# Patient Record
Sex: Male | Born: 1975
Health system: Southern US, Community
[De-identification: ages and names within clinical notes are randomized; demographics above are authoritative.]

## PROBLEM LIST (undated history)

## (undated) DIAGNOSIS — E785 Hyperlipidemia, unspecified: Secondary | ICD-10-CM

## (undated) HISTORY — PX: VASECTOMY: SHX75

## (undated) HISTORY — DX: Hyperlipidemia, unspecified: E78.5

## (undated) HISTORY — PX: BREAST BIOPSY: SHX20

---

## 2005-09-17 HISTORY — PX: COLONOSCOPY: SHX174

## 2018-10-27 ENCOUNTER — Encounter: Payer: Self-pay | Admitting: Family Medicine

## 2018-10-27 ENCOUNTER — Ambulatory Visit: Payer: BLUE CROSS/BLUE SHIELD | Admitting: Family Medicine

## 2018-10-27 VITALS — BP 116/64 | HR 75 | Temp 98.2°F | Ht 72.0 in | Wt 184.4 lb

## 2018-10-27 DIAGNOSIS — Z131 Encounter for screening for diabetes mellitus: Secondary | ICD-10-CM

## 2018-10-27 DIAGNOSIS — Z113 Encounter for screening for infections with a predominantly sexual mode of transmission: Secondary | ICD-10-CM | POA: Diagnosis not present

## 2018-10-27 DIAGNOSIS — Z13220 Encounter for screening for lipoid disorders: Secondary | ICD-10-CM | POA: Diagnosis not present

## 2018-10-27 DIAGNOSIS — Z0000 Encounter for general adult medical examination without abnormal findings: Secondary | ICD-10-CM | POA: Diagnosis not present

## 2018-10-27 NOTE — Patient Instructions (Addendum)
The labs is open each week day at 7:45 AM.  The lab closes at 5 pm on Monday, Thursday, and Friday.  On Tuesday and Wednesday the lab closes at 5:30pm.  Preventive Care 40-64 Years, Male Preventive care refers to lifestyle choices and visits with your health care provider that can promote health and wellness. What does preventive care include?   A yearly physical exam. This is also called an annual well check.  Dental exams once or twice a year.  Routine eye exams. Ask your health care provider how often you should have your eyes checked.  Personal lifestyle choices, including: ? Daily care of your teeth and gums. ? Regular physical activity. ? Eating a healthy diet. ? Avoiding tobacco and drug use. ? Limiting alcohol use. ? Practicing safe sex. ? Taking low-dose aspirin every day starting at age 16. What happens during an annual well check? The services and screenings done by your health care provider during your annual well check will depend on your age, overall health, lifestyle risk factors, and family history of disease. Counseling Your health care provider may ask you questions about your:  Alcohol use.  Tobacco use.  Drug use.  Emotional well-being.  Home and relationship well-being.  Sexual activity.  Eating habits.  Work and work Statistician. Screening You may have the following tests or measurements:  Height, weight, and BMI.  Blood pressure.  Lipid and cholesterol levels. These may be checked every 5 years, or more frequently if you are over 24 years old.  Skin check.  Lung cancer screening. You may have this screening every year starting at age 70 if you have a 30-pack-year history of smoking and currently smoke or have quit within the past 15 years.  Colorectal cancer screening. All adults should have this screening starting at age 38 and continuing until age 58. Your health care provider may recommend screening at age 93. You will have tests every  1-10 years, depending on your results and the type of screening test. People at increased risk should start screening at an earlier age. Screening tests may include: ? Guaiac-based fecal occult blood testing. ? Fecal immunochemical test (FIT). ? Stool DNA test. ? Virtual colonoscopy. ? Sigmoidoscopy. During this test, a flexible tube with a tiny camera (sigmoidoscope) is used to examine your rectum and lower colon. The sigmoidoscope is inserted through your anus into your rectum and lower colon. ? Colonoscopy. During this test, a long, thin, flexible tube with a tiny camera (colonoscope) is used to examine your entire colon and rectum.  Prostate cancer screening. Recommendations will vary depending on your family history and other risks.  Hepatitis C blood test.  Hepatitis B blood test.  Sexually transmitted disease (STD) testing.  Diabetes screening. This is done by checking your blood sugar (glucose) after you have not eaten for a while (fasting). You may have this done every 1-3 years. Discuss your test results, treatment options, and if necessary, the need for more tests with your health care provider. Vaccines Your health care provider may recommend certain vaccines, such as:  Influenza vaccine. This is recommended every year.  Tetanus, diphtheria, and acellular pertussis (Tdap, Td) vaccine. You may need a Td booster every 10 years.  Varicella vaccine. You may need this if you have not been vaccinated.  Zoster vaccine. You may need this after age 32.  Measles, mumps, and rubella (MMR) vaccine. You may need at least one dose of MMR if you were born in 1957 or  later. You may also need a second dose.  Pneumococcal 13-valent conjugate (PCV13) vaccine. You may need this if you have certain conditions and have not been vaccinated.  Pneumococcal polysaccharide (PPSV23) vaccine. You may need one or two doses if you smoke cigarettes or if you have certain conditions.  Meningococcal  vaccine. You may need this if you have certain conditions.  Hepatitis A vaccine. You may need this if you have certain conditions or if you travel or work in places where you may be exposed to hepatitis A.  Hepatitis B vaccine. You may need this if you have certain conditions or if you travel or work in places where you may be exposed to hepatitis B.  Haemophilus influenzae type b (Hib) vaccine. You may need this if you have certain risk factors. Talk to your health care provider about which screenings and vaccines you need and how often you need them. This information is not intended to replace advice given to you by your health care provider. Make sure you discuss any questions you have with your health care provider. Document Released: 09/30/2015 Document Revised: 10/24/2017 Document Reviewed: 07/05/2015 Elsevier Interactive Patient Education  2019 [employer].

## 2018-10-27 NOTE — Progress Notes (Signed)
Subjective:     Thomas Mahoney is a 43 y.o. male and is here for a comprehensive physical exam. The patient reports no problems.  Pt tries to eat healthy and exercise at least 3 times per week.  Pt drinking mostly water throughout the day.  Takes a MVI daily.  Allergies: Codeine-hives  Past surgical history: Breast biopsy-2004  Social history: Patient is single.  Patient completed some college.  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  Patient recently moved to the area from [place].  Patient denies alcohol, tobacco, drug use.  Healthcare maintenance: Colonoscopy--2007 Immunizations--TB test 2019.  Patient does not get influenza vaccines. Dentist--Dr. Enzo Bi DeShield-Mayes  Family medical history: Mom-alive Sister-Ruisha, alive Daughters (2), alive Son-alive MGM-breast cancer with mets to lungs MGF-colon cancer Social History   Socioeconomic History  . Marital status: Unknown    Spouse name: Not on file  . Number of children: Not on file  . Years of education: Not on file  . Highest education level: Not on file  Occupational History  . Not on file  Social Needs  . Financial resource strain: Not on file  . Food insecurity:    Worry: Not on file    Inability: Not on file  . Transportation needs:    Medical: Not on file    Non-medical: Not on file  Tobacco Use  . Smoking status: Never Smoker  . Smokeless tobacco: Never Used  Substance and Sexual Activity  . Alcohol use: Never    Frequency: Never  . Drug use: Not on file  . Sexual activity: Not on file  Lifestyle  . Physical activity:    Days per week: Not on file    Minutes per session: Not on file  . Stress: Not on file  Relationships  . Social connections:    Talks on phone: Not on file    Gets together: Not on file    Attends religious service: Not on file    Active member of club or organization: Not on file    Attends meetings of clubs or organizations: Not on file     Relationship status: Not on file  . Intimate partner violence:    Fear of current or ex partner: Not on file    Emotionally abused: Not on file    Physically abused: Not on file    Forced sexual activity: Not on file  Other Topics Concern  . Not on file  Social History Narrative  . Not on file   Health Maintenance  Topic Date Due  . HIV Screening  10/19/1989  . TETANUS/TDAP  10/19/1993  . INFLUENZA VACCINE  10/28/2019 (Originally 04/17/2018)    The following portions of the patient's history were reviewed and updated as appropriate: allergies, current medications, past family history, past medical history, past social history, past surgical history and problem list.  Review of Systems A comprehensive review of systems was negative.   Objective:    BP 116/64 (BP Location: Left Arm, Patient Position: Sitting, Cuff Size: Normal)   Pulse 75   Temp 98.2 F (36.8 C) (Oral)   Ht 6' (1.829 m)   Wt 184 lb 6.4 oz (83.6 kg)   BMI 25.01 kg/m  General appearance: alert, cooperative and no distress Head: Normocephalic, without obvious abnormality, atraumatic Eyes: conjunctivae/corneas clear. PERRL, EOM's intact. Fundi benign. Ears: normal TM's and external ear canals both ears Nose: Nares normal. Septum midline. Mucosa normal. No drainage or sinus tenderness. Throat:  lips, mucosa, and tongue normal; teeth and gums normal Neck: no adenopathy, no carotid bruit, no JVD, supple, symmetrical, trachea midline and thyroid not enlarged, symmetric, no tenderness/mass/nodules Lungs: clear to auscultation bilaterally Heart: regular rate and rhythm, S1, S2 normal, no murmur, click, rub or gallop Abdomen: soft, non-tender; bowel sounds normal; no masses,  no organomegaly Extremities: extremities normal, atraumatic, no cyanosis or edema Pulses: 2+ and symmetric Skin: Skin color, texture, turgor normal. No rashes or lesions Lymph nodes: Cervical, supraclavicular, and axillary nodes  normal. Neurologic: Alert and oriented X 3, normal strength and tone. Normal symmetric reflexes. Normal coordination and gait    Assessment:    Healthy male exam.      Plan:   Anticipatory guidance given including wearing seatbelts, smoke detectors in the home, increasing physical activity, increasing p.o. intake of water and vegetables. -Patient to return tomorrow for labs when he is fasting. -We will also obtain STI testing -Offered influenza vaccine, however patient declines -Given handout -Next CPE in 1 year See After Visit Summary for Counseling Recommendations   Follow-up PRN  Grier Mitts, MD

## 2018-10-28 DIAGNOSIS — Z113 Encounter for screening for infections with a predominantly sexual mode of transmission: Secondary | ICD-10-CM | POA: Diagnosis not present

## 2018-10-28 LAB — BASIC METABOLIC PANEL
BUN: 12 mg/dL (ref 6–23)
CO2: 31 mEq/L (ref 19–32)
CREATININE: 1.21 mg/dL (ref 0.40–1.50)
Calcium: 9.9 mg/dL (ref 8.4–10.5)
Chloride: 101 mEq/L (ref 96–112)
GFR: 78.82 mL/min (ref >60.00)
GLUCOSE: 91 mg/dL (ref 70–99)
Potassium: 4.5 mEq/L (ref 3.5–5.1)
Sodium: 139 mEq/L (ref 135–145)

## 2018-10-28 LAB — LIPID PANEL
CHOLESTEROL: 274 mg/dL — AB (ref 0–200)
HDL: 50 mg/dL (ref >39.00)
LDL Cholesterol: 198 mg/dL — ABNORMAL HIGH (ref 0–99)
NonHDL: 224.13
Total CHOL/HDL Ratio: 5
Triglycerides: 130 mg/dL (ref 0.0–149.0)
VLDL: 26 mg/dL (ref 0.0–40.0)

## 2018-10-28 LAB — CBC
HCT: 46.4 % (ref 39.0–52.0)
Hemoglobin: 15.8 g/dL (ref 13.0–17.0)
MCHC: 34 g/dL (ref 30.0–36.0)
MCV: 94.1 fl (ref 78.0–100.0)
Platelets: 217 10*3/uL (ref 150.0–400.0)
RBC: 4.93 Mil/uL (ref 4.22–5.81)
RDW: 12.3 % (ref 11.5–15.5)
WBC: 6.6 10*3/uL (ref 4.0–10.5)

## 2018-10-28 LAB — HEMOGLOBIN A1C: Hgb A1c MFr Bld: 5.8 % (ref 4.6–6.5)

## 2018-10-28 NOTE — Addendum Note (Signed)
Addended by: Elmer Picker on: 10/28/2018 09:07 AM   Modules accepted: Orders

## 2018-10-29 LAB — HIV ANTIBODY (ROUTINE TESTING W REFLEX): HIV 1&2 Ab, 4th Generation: NONREACTIVE

## 2018-10-29 LAB — C. TRACHOMATIS/N. GONORRHOEAE RNA
C. TRACHOMATIS RNA, TMA: NOT DETECTED
N. gonorrhoeae RNA, TMA: NOT DETECTED

## 2018-10-29 LAB — RPR: RPR: NONREACTIVE

## 2018-11-21 ENCOUNTER — Telehealth: Payer: Self-pay | Admitting: *Deleted

## 2018-11-21 DIAGNOSIS — Z3009 Encounter for other general counseling and advice on contraception: Secondary | ICD-10-CM

## 2018-11-21 NOTE — Telephone Encounter (Signed)
Copied from [place] [id number]. Topic: Referral - Request for Referral >> Nov 21, 2018  2:08 PM Vernona Rieger wrote: Has patient seen PCP for this complaint? No *If NO, is insurance requiring patient see PCP for this issue before PCP can refer them? Referral for which specialty: Urology  Preferred provider/office: New to area so he is unsure Reason for referral: Vasectomy

## 2018-11-21 NOTE — Telephone Encounter (Signed)
Ok to place referral.

## 2018-11-21 NOTE — Telephone Encounter (Signed)
Okay to place referral to urology for vasectomy

## 2018-11-24 NOTE — Telephone Encounter (Signed)
Referral to Urology has been placed.

## 2018-12-16 DIAGNOSIS — Z3009 Encounter for other general counseling and advice on contraception: Secondary | ICD-10-CM | POA: Diagnosis not present

## 2019-01-27 DIAGNOSIS — Z302 Encounter for sterilization: Secondary | ICD-10-CM | POA: Diagnosis not present

## 2019-02-04 ENCOUNTER — Other Ambulatory Visit: Payer: Self-pay | Admitting: Family Medicine

## 2019-02-04 DIAGNOSIS — E785 Hyperlipidemia, unspecified: Secondary | ICD-10-CM

## 2019-02-05 ENCOUNTER — Other Ambulatory Visit (INDEPENDENT_AMBULATORY_CARE_PROVIDER_SITE_OTHER): Payer: BLUE CROSS/BLUE SHIELD

## 2019-02-05 ENCOUNTER — Other Ambulatory Visit: Payer: Self-pay

## 2019-02-05 DIAGNOSIS — E785 Hyperlipidemia, unspecified: Secondary | ICD-10-CM

## 2019-02-05 LAB — LIPID PANEL
Cholesterol: 239 mg/dL — ABNORMAL HIGH (ref 0–200)
HDL: 48.9 mg/dL (ref >39.00)
LDL Cholesterol: 170 mg/dL — ABNORMAL HIGH (ref 0–99)
NonHDL: 189.84
Total CHOL/HDL Ratio: 5
Triglycerides: 100 mg/dL (ref 0.0–149.0)
VLDL: 20 mg/dL (ref 0.0–40.0)

## 2019-06-19 DIAGNOSIS — Z20828 Contact with and (suspected) exposure to other viral communicable diseases: Secondary | ICD-10-CM | POA: Diagnosis not present

## 2019-12-02 ENCOUNTER — Encounter: Payer: BLUE CROSS/BLUE SHIELD | Admitting: Family Medicine

## 2019-12-30 ENCOUNTER — Other Ambulatory Visit: Payer: Self-pay

## 2019-12-31 ENCOUNTER — Encounter: Payer: Self-pay | Admitting: Family Medicine

## 2019-12-31 ENCOUNTER — Ambulatory Visit (INDEPENDENT_AMBULATORY_CARE_PROVIDER_SITE_OTHER): Payer: BC Managed Care – PPO | Admitting: Family Medicine

## 2019-12-31 ENCOUNTER — Other Ambulatory Visit (HOSPITAL_COMMUNITY)
Admission: RE | Admit: 2019-12-31 | Discharge: 2019-12-31 | Disposition: A | Discharge status: Home / Self Care | Payer: BC Managed Care – PPO | Source: Ambulatory Visit | Attending: Family Medicine | Admitting: Family Medicine

## 2019-12-31 VITALS — BP 98/68 | HR 66 | Temp 97.8°F | Wt 181.0 lb

## 2019-12-31 DIAGNOSIS — E782 Mixed hyperlipidemia: Secondary | ICD-10-CM

## 2019-12-31 DIAGNOSIS — Z125 Encounter for screening for malignant neoplasm of prostate: Secondary | ICD-10-CM

## 2019-12-31 DIAGNOSIS — T23122A Burn of first degree of single left finger (nail) except thumb, initial encounter: Secondary | ICD-10-CM

## 2019-12-31 DIAGNOSIS — Z113 Encounter for screening for infections with a predominantly sexual mode of transmission: Secondary | ICD-10-CM | POA: Diagnosis not present

## 2019-12-31 DIAGNOSIS — R200 Anesthesia of skin: Secondary | ICD-10-CM | POA: Diagnosis not present

## 2019-12-31 DIAGNOSIS — Z0000 Encounter for general adult medical examination without abnormal findings: Secondary | ICD-10-CM | POA: Diagnosis not present

## 2019-12-31 DIAGNOSIS — R7303 Prediabetes: Secondary | ICD-10-CM | POA: Diagnosis not present

## 2019-12-31 LAB — LIPID PANEL
Cholesterol: 257 mg/dL — ABNORMAL HIGH (ref 0–200)
HDL: 46.6 mg/dL (ref >39.00)
LDL Cholesterol: 190 mg/dL — ABNORMAL HIGH (ref 0–99)
NonHDL: 210.75
Total CHOL/HDL Ratio: 6
Triglycerides: 106 mg/dL (ref 0.0–149.0)
VLDL: 21.2 mg/dL (ref 0.0–40.0)

## 2019-12-31 LAB — COMPREHENSIVE METABOLIC PANEL
ALT: 25 U/L (ref 0–53)
AST: 22 U/L (ref 0–37)
Albumin: 4.6 g/dL (ref 3.5–5.2)
Alkaline Phosphatase: 74 U/L (ref 39–117)
BUN: 11 mg/dL (ref 6–23)
CO2: 31 mEq/L (ref 19–32)
Calcium: 10 mg/dL (ref 8.4–10.5)
Chloride: 101 mEq/L (ref 96–112)
Creatinine, Ser: 1.19 mg/dL (ref 0.40–1.50)
GFR: 79.92 mL/min (ref >60.00)
Glucose, Bld: 91 mg/dL (ref 70–99)
Potassium: 4.6 mEq/L (ref 3.5–5.1)
Sodium: 137 mEq/L (ref 135–145)
Total Bilirubin: 0.4 mg/dL (ref 0.2–1.2)
Total Protein: 8 g/dL (ref 6.0–8.3)

## 2019-12-31 LAB — CBC
HCT: 46.6 % (ref 39.0–52.0)
Hemoglobin: 15.6 g/dL (ref 13.0–17.0)
MCHC: 33.4 g/dL (ref 30.0–36.0)
MCV: 94.7 fl (ref 78.0–100.0)
Platelets: 239 10*3/uL (ref 150.0–400.0)
RBC: 4.92 Mil/uL (ref 4.22–5.81)
RDW: 12.6 % (ref 11.5–15.5)
WBC: 7.8 10*3/uL (ref 4.0–10.5)

## 2019-12-31 LAB — HEMOGLOBIN A1C: Hgb A1c MFr Bld: 5.8 % (ref 4.6–6.5)

## 2019-12-31 LAB — VITAMIN B12: Vitamin B-12: 1044 pg/mL — ABNORMAL HIGH (ref 211–911)

## 2019-12-31 LAB — TSH: TSH: 0.71 u[IU]/mL (ref 0.35–4.50)

## 2019-12-31 LAB — PSA: PSA: 1.46 ng/mL (ref 0.10–4.00)

## 2019-12-31 NOTE — Progress Notes (Signed)
Subjective:     Thomas Mahoney is a 44 y.o. male and is here for a comprehensive physical exam. The patient reports problems - numbness in L fingers and burn of finger.  Pt notes numbness in fingers of left hand after receiving doses of Moderna Covid-19 vaccine.  Pt's first dose was on 2/6 and second on 3/6.  Pt noted numbness after first dose.  Happens intermittently, does not last long.  Sensation is sharp, radiating in nature to fingers.  Pt not had to take anything for this sensation.   Pt has never had this happen before.  Typically does not get flu shots.  Denies edema, erythema, weakness, wrist pain, pain that wakes him up at night.  Pt also notes small burn on left finger after touching a hot cast iron hand.    Overall pt states he is doing well.  Since last LMP patient has eliminated processed sugars from his diet, drinking well water, and trying to be healthy overall.  Expressed concern that he may have had Covid in early March 2020 as he became sick.  Symptoms at the time were "worse than the flu".  Pt stayed home from work x1 week.  States his son then got sick.  Pt's 15 yo daughter is doing well.   Pt notes getting a promotion and having to relocate to [place], [place], but drove down for the [facility].  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  Social History   Socioeconomic History  . Marital status: Unknown    Spouse name: Not on file  . Number of children: Not on file  . Years of education: Not on file  . Highest education level: Not on file  Occupational History  . Not on file  Tobacco Use  . Smoking status: Never Smoker  . Smokeless tobacco: Never Used  Substance and Sexual Activity  . Alcohol use: Never  . Drug use: Not on file  . Sexual activity: Not on file  Other Topics Concern  . Not on file  Social History Narrative  . Not on file   Social Determinants of Health   Financial Resource Strain:   . Difficulty of Paying Living Expenses:   Food  Insecurity:   . Worried About Charity fundraiser in the Last Year:   . Arboriculturist in the Last Year:   Transportation Needs:   . Film/video editor (Medical):   Marland Kitchen Lack of Transportation (Non-Medical):   Physical Activity:   . Days of Exercise per Week:   . Minutes of Exercise per Session:   Stress:   . Feeling of Stress :   Social Connections:   . Frequency of Communication with Friends and Family:   . Frequency of Social Gatherings with Friends and Family:   . Attends Religious Services:   . Active Member of Clubs or Organizations:   . Attends Archivist Meetings:   Marland Kitchen Marital Status:   Intimate Partner Violence:   . Fear of Current or Ex-Partner:   . Emotionally Abused:   Marland Kitchen Physically Abused:   . Sexually Abused:    Health Maintenance  Topic Date Due  . TETANUS/TDAP  Never done  . INFLUENZA VACCINE  04/17/2020  . HIV Screening  Completed    The following portions of the patient's history were reviewed and updated as appropriate: allergies, current medications, past family history, past medical history, past social history, past surgical history and problem list.  Review of Systems Pertinent items noted in HPI and remainder of comprehensive ROS otherwise negative.   Objective:    BP 98/68 (BP Location: Left Arm, Patient Position: Sitting, Cuff Size: Normal)   Pulse 66   Temp 97.8 F (36.6 C) (Temporal)   Wt 181 lb (82.1 kg)   SpO2 98%   BMI 24.55 kg/m  General appearance: alert, cooperative and no distress Head: Normocephalic, without obvious abnormality, atraumatic Eyes: conjunctivae/corneas clear. PERRL, EOM's intact. Fundi benign. Ears: normal TM's and external ear canals both ears Nose: Nares normal. Septum midline. Mucosa normal. No drainage or sinus tenderness. Throat: lips, mucosa, and tongue normal; teeth and gums normal Neck: no adenopathy, no carotid bruit, no JVD, supple, symmetrical, trachea midline and thyroid not enlarged,  symmetric, no tenderness/mass/nodules Lungs: clear to auscultation bilaterally Heart: regular rate and rhythm, S1, S2 normal, no murmur, click, rub or gallop Abdomen: soft, non-tender; bowel sounds normal; no masses,  no organomegaly Extremities: extremities normal, atraumatic, no cyanosis or edema no deformities.  No TTP of bilateral arms, forearms, hands. B/l grip strength 5/5. Pulses: 2+ and symmetric Skin: Skin color, texture, turgor normal. No rashes.  Left proximal fourth digit palmar surface with 1.5 cm oval-shaped area, gray in color, slightly raised and fluid-filled, tender to palpation without erythema Lymph nodes: Cervical, supraclavicular, and axillary nodes normal. Neurologic: Alert and oriented X 3, normal strength and tone. Normal symmetric reflexes. Normal coordination and gait Sensation intact in bilateral upper extremities.  Negative Tinel and Phalen's bilaterally.  No numbness produced with tapping on bilateral lateral and medial epicondyles.  Negative axial loading.     Assessment:    Healthy male exam with numbness in fingers of left hand after COVID vaccine.     Plan:     Anticipatory guidance given including wearing seatbelts, smoke detectors in the home, increasing physical activity, increasing p.o. intake of water and vegetables. -will obtian labs -Reviewed preventive care.  Consider starting colon cancer screening at age 32 in [place] males -Given handout -Next CPE in 1 year See After Visit Summary for Counseling Recommendations    Superficial burn of finger of left hand, initial encounter -Silvadene cream applied -Continue Silvadene -Given handout -Given precautions for worsening  Prediabetes  -Hemoglobin A1c 5.8% on 10/28/2018 -Continue lifestyle modifications - Plan: Hemoglobin A1c  Mixed hyperlipidemia -Elevated total and LDL cholesterol in the past -Continue lifestyle modifications -Consider medications for continued elevation - Plan: Lipid  panel  Screening for prostate cancer  - Plan: PSA  Numbness  -Likely neuropathy 2/2 Covid vaccine injections.  Also consider vitamin deficiencies-however less likely as only affecting fingers of left hand, cervical radiculopathy-though exam negative for symptoms with axial loading, other causes of nerve compression such as postural -Discussed supportive care - Plan: Vitamin B12, TSH  Routine screening for STI (sexually transmitted infection)  - Plan: RPR, HIV antibody (with reflex), Urine cytology ancillary only  Follow-up as needed  Grier Mitts, MD

## 2019-12-31 NOTE — Patient Instructions (Signed)
Preventive Care 41-45 Years Old, Male Preventive care refers to lifestyle choices and visits with your health care provider that can promote health and wellness. This includes:  A yearly physical exam. This is also called an annual well check.  Regular dental and eye exams.  Immunizations.  Screening for certain conditions.  Healthy lifestyle choices, such as eating a healthy diet, getting regular exercise, not using drugs or products that contain nicotine and tobacco, and limiting alcohol use. What can I expect for my preventive care visit? Physical exam Your health care provider will check:  Height and weight. These may be used to calculate body mass index (BMI), which is a measurement that tells if you are at a healthy weight.  Heart rate and blood pressure.  Your skin for abnormal spots. Counseling Your health care provider may ask you questions about:  Alcohol, tobacco, and drug use.  Emotional well-being.  Home and relationship well-being.  Sexual activity.  Eating habits.  Work and work Statistician. What immunizations do I need?  Influenza (flu) vaccine  This is recommended every year. Tetanus, diphtheria, and pertussis (Tdap) vaccine  You may need a Td booster every 10 years. Varicella (chickenpox) vaccine  You may need this vaccine if you have not already been vaccinated. Zoster (shingles) vaccine  You may need this after age 64. Measles, mumps, and rubella (MMR) vaccine  You may need at least one dose of MMR if you were born in 1957 or later. You may also need a second dose. Pneumococcal conjugate (PCV13) vaccine  You may need this if you have certain conditions and were not previously vaccinated. Pneumococcal polysaccharide (PPSV23) vaccine  You may need one or two doses if you smoke cigarettes or if you have certain conditions. Meningococcal conjugate (MenACWY) vaccine  You may need this if you have certain conditions. Hepatitis A  vaccine  You may need this if you have certain conditions or if you travel or work in places where you may be exposed to hepatitis A. Hepatitis B vaccine  You may need this if you have certain conditions or if you travel or work in places where you may be exposed to hepatitis B. Haemophilus influenzae type b (Hib) vaccine  You may need this if you have certain risk factors. Human papillomavirus (HPV) vaccine  If recommended by your health care provider, you may need three doses over 6 months. You may receive vaccines as individual doses or as more than one vaccine together in one shot (combination vaccines). Talk with your health care provider about the risks and benefits of combination vaccines. What tests do I need? Blood tests  Lipid and cholesterol levels. These may be checked every 5 years, or more frequently if you are over 60 years old.  Hepatitis C test.  Hepatitis B test. Screening  Lung cancer screening. You may have this screening every year starting at age 43 if you have a 30-pack-year history of smoking and currently smoke or have quit within the past 15 years.  Prostate cancer screening. Recommendations will vary depending on your family history and other risks.  Colorectal cancer screening. All adults should have this screening starting at age 72 and continuing until age 2. Your health care provider may recommend screening at age 14 if you are at increased risk. You will have tests every 1-10 years, depending on your results and the type of screening test.  Diabetes screening. This is done by checking your blood sugar (glucose) after you have not eaten  for a while (fasting). You may have this done every 1-3 years.  Sexually transmitted disease (STD) testing. Follow these instructions at home: Eating and drinking  Eat a diet that includes fresh fruits and vegetables, whole grains, lean protein, and low-fat dairy products.  Take vitamin and mineral supplements as  recommended by your health care provider.  Do not drink alcohol if your health care provider tells you not to drink.  If you drink alcohol: ? Limit how much you have to 0-2 drinks a day. ? Be aware of how much alcohol is in your drink. In the [place] one drink equals one 12 oz bottle of beer (355 mL), one 5 oz glass of wine (148 mL), or one 1 oz glass of hard liquor (44 mL). Lifestyle  Take daily care of your teeth and gums.  Stay active. Exercise for at least 30 minutes on 5 or more days each week.  Do not use any products that contain nicotine or tobacco, such as cigarettes, e-cigarettes, and chewing tobacco. If you need help quitting, ask your health care provider.  If you are sexually active, practice safe sex. Use a condom or other form of protection to prevent STIs (sexually transmitted infections).  Talk with your health care provider about taking a low-dose aspirin every day starting at age 14. What's next?  Go to your health care provider once a year for a well check visit.  Ask your health care provider how often you should have your eyes and teeth checked.  Stay up to date on all vaccines. This information is not intended to replace advice given to you by your health care provider. Make sure you discuss any questions you have with your health care provider. Document Revised: 08/28/2018 Document Reviewed: 08/28/2018 Elsevier Patient Education  [place], Adult A burn is an injury to the skin or the tissues under the skin. There are three types of burns:  First degree. These burns may cause the skin to be red and slightly swollen.  Second degree. These burns are very painful and cause the skin to be very red. The skin may also leak fluid, look shiny, and develop blisters.  Third degree. These burns cause permanent damage. They either turn the skin white or black and make it look charred, dry, and leathery. Taking care of your burn properly can help to  prevent pain and infection. It can also help the burn to heal more quickly. What are the risks? Complications from burns include:  Damage to the skin.  Reduced blood flow near the injury.  Dead tissue.  Scarring.  Problems with movement, if the burn happened near a joint or on the hands or feet. Severe burns can lead to problems that affect the whole body, such as:  Fluid loss.  Less blood circulating in the body.  Inability to maintain a normal core body temperature (thermoregulation).  Infection.  Shock.  Problems breathing. How to care for a first-degree burn Right after a burn:  Rinse or soak the burn under cool water until the pain stops. Do not put ice on your burn. This can cause more damage.  Lightly cover the burn with a sterile cloth (dressing). Burn care  Follow instructions from your health care provider about: ? How to clean and take care of the burn. ? When to change and remove the dressing.  Check your burn every day for signs of infection. Check for: ? More redness, swelling, or pain. ?  Warmth. ? Pus or a bad smell. Medicine  Take over-the-counter and prescription medicines only as told by your health care provider.  If you were prescribed antibiotic medicine, take or apply it as told by your health care provider. Do not stop using the antibiotic even if your condition improves. General instructions  To prevent infection, do not put butter, oil, or other home remedies on your burn.  Do not rub your burn, even when you are cleaning it.  Protect your burn from the sun. How to care for a second-degree burn Right after a burn:  Rinse or soak the burn under cool water. Do this for several minutes. Do not put ice on your burn. This can cause more damage.  Lightly cover the burn with a sterile cloth (dressing). Burn care  Raise (elevate) the injured area above the level of your heart while sitting or lying down.  Follow instructions from your  health care provider about: ? How to clean and take care of the burn. ? When to change and remove the dressing.  Check your burn every day for signs of infection. Check for: ? More redness, swelling, or pain. ? Warmth. ? Pus or a bad smell. Medicine   Take over-the-counter and prescription medicines only as told by your health care provider.  If you were prescribed antibiotic medicine, take or apply it as told by your health care provider. Do not stop using the antibiotic even if your condition improves. General instructions  To prevent infection: ? Do not put butter, oil, or other home remedies on the burn. ? Do not scratch or pick at the burn. ? Do not break any blisters. ? Do not peel skin.  Do not rub your burn, even when you are cleaning it.  Protect your burn from the sun. How to care for a third-degree burn Right after a burn:  Lightly cover the burn with gauze.  Seek immediate medical attention. Burn care  Raise (elevate) the injured area above the level of your heart while sitting or lying down.  Drink enough fluid to keep your urine clear or pale yellow.  Rest as told by your health care provider. Do not participate in sports or other physical activities until your health care provider approves.  Follow instructions from your health care provider about: ? How to clean and take care of the burn. ? When to change and remove the dressing.  Check your burn every day for signs of infection. Check for: ? More redness, swelling, or pain. ? Warmth. ? Pus or a bad smell. Medicine  Take over-the-counter and prescription medicines only as told by your health care provider.  If you were prescribed antibiotic medicine, take or apply it as told by your health care provider. Do not stop using the antibiotic even if your condition improves. General instructions  To prevent infection: ? Do not put butter, oil, or other home remedies on the burn. ? Do not scratch or  pick at the burn. ? Do not break any blisters. ? Do not peel skin. ? Do not rub your burn, even when you are cleaning it.  Protect your burn from the sun.  Keep all follow-up visits as told by your health care provider. This is important. Contact a health care provider if:  Your condition does not improve.  Your condition gets worse.  You have a fever.  Your burn changes in appearance or develops black or red spots.  Your burn feels warm to  the touch.  Your pain is not controlled with medicine. Get help right away if:  You have redness, swelling, or pain at the site of the burn.  You have fluid, blood, or pus coming from your burn.  You have red streaks near the burn.  You have severe pain. This information is not intended to replace advice given to you by your health care provider. Make sure you discuss any questions you have with your health care provider. Document Revised: 08/16/2017 Document Reviewed: 02/21/2016 Elsevier Patient Education  [place].

## 2020-01-01 ENCOUNTER — Encounter: Payer: Self-pay | Admitting: Family Medicine

## 2020-01-01 ENCOUNTER — Telehealth: Payer: Self-pay | Admitting: Family Medicine

## 2020-01-01 DIAGNOSIS — E782 Mixed hyperlipidemia: Secondary | ICD-10-CM | POA: Insufficient documentation

## 2020-01-01 DIAGNOSIS — R7303 Prediabetes: Secondary | ICD-10-CM | POA: Insufficient documentation

## 2020-01-01 LAB — URINE CYTOLOGY ANCILLARY ONLY
Chlamydia: NEGATIVE
Comment: NEGATIVE
Comment: NORMAL
Neisseria Gonorrhea: NEGATIVE

## 2020-01-01 LAB — RPR: RPR Ser Ql: NONREACTIVE

## 2020-01-01 LAB — HIV ANTIBODY (ROUTINE TESTING W REFLEX): HIV 1&2 Ab, 4th Generation: NONREACTIVE

## 2020-01-01 NOTE — Telephone Encounter (Signed)
Pt called with lab results.  Results reviewed.  Questions answered to satisfaction.  Grier Mitts, MD

## 2020-07-04 ENCOUNTER — Ambulatory Visit: Payer: BC Managed Care – PPO | Admitting: Family Medicine

## 2020-07-04 ENCOUNTER — Encounter: Payer: Self-pay | Admitting: Family Medicine

## 2020-07-04 ENCOUNTER — Other Ambulatory Visit: Payer: Self-pay

## 2020-07-04 ENCOUNTER — Ambulatory Visit (INDEPENDENT_AMBULATORY_CARE_PROVIDER_SITE_OTHER): Payer: BC Managed Care – PPO

## 2020-07-04 ENCOUNTER — Other Ambulatory Visit: Payer: Self-pay | Admitting: Family Medicine

## 2020-07-04 VITALS — BP 118/80 | HR 55 | Temp 98.2°F | Wt 188.2 lb

## 2020-07-04 DIAGNOSIS — R202 Paresthesia of skin: Secondary | ICD-10-CM

## 2020-07-04 DIAGNOSIS — G4700 Insomnia, unspecified: Secondary | ICD-10-CM

## 2020-07-04 DIAGNOSIS — D229 Melanocytic nevi, unspecified: Secondary | ICD-10-CM | POA: Diagnosis not present

## 2020-07-04 DIAGNOSIS — M47812 Spondylosis without myelopathy or radiculopathy, cervical region: Secondary | ICD-10-CM | POA: Diagnosis not present

## 2020-07-04 DIAGNOSIS — N529 Male erectile dysfunction, unspecified: Secondary | ICD-10-CM

## 2020-07-04 DIAGNOSIS — M5412 Radiculopathy, cervical region: Secondary | ICD-10-CM

## 2020-07-04 NOTE — Patient Instructions (Signed)
Paresthesia Paresthesia is an abnormal burning or prickling sensation. It is usually felt in the hands, arms, legs, or feet. However, it may occur in any part of the body. Usually, paresthesia is not painful. It may feel like:  Tingling or numbness.  Buzzing.  Itching. Paresthesia may occur without any clear cause, or it may be caused by:  Breathing too quickly (hyperventilation).  Pressure on a nerve.  An underlying medical condition.  Side effects of a medication.  Nutritional deficiencies.  Exposure to toxic chemicals. Most people experience temporary (transient) paresthesia at some time in their lives. For some people, it may be long-lasting (chronic) because of an underlying medical condition. If you have paresthesia that lasts a long time, you may need to be evaluated by your health care provider. Follow these instructions at home: Alcohol use   Do not drink alcohol if: ? Your health care provider tells you not to drink. ? You are pregnant, may be pregnant, or are planning to become pregnant.  If you drink alcohol: ? Limit how much you use to:  0-1 drink a day for women.  0-2 drinks a day for men. ? Be aware of how much alcohol is in your drink. In the [place] one drink equals one 12 oz bottle of beer (355 mL), one 5 oz glass of wine (148 mL), or one 1 oz glass of hard liquor (44 mL). Nutrition   Eat a healthy diet. This includes: ? Eating foods that are high in fiber, such as fresh fruits and vegetables, whole grains, and beans. ? Limiting foods that are high in fat and processed sugars, such as fried or sweet foods. General instructions  Take over-the-counter and prescription medicines only as told by your health care provider.  Do not use any products that contain nicotine or tobacco, such as cigarettes and e-cigarettes. These can keep blood from reaching damaged nerves. If you need help quitting, ask your health care provider.  If you have diabetes, work  closely with your health care provider to keep your blood sugar under control.  If you have numbness in your feet: ? Check every day for signs of injury or infection. Watch for redness, warmth, and swelling. ? Wear padded socks and comfortable shoes. These help protect your feet.  Keep all follow-up visits as told by your health care provider. This is important. Contact a health care provider if you:  Have paresthesia that gets worse or does not go away.  Have a burning or prickling feeling that gets worse when you walk.  Have pain, cramps, or dizziness.  Develop a rash. Get help right away if you:  Feel weak.  Have trouble walking or moving.  Have problems with speech, understanding, or vision.  Feel confused.  Cannot control your bladder or bowel movements.  Have numbness after an injury.  Develop new weakness in an arm or leg.  Faint. Summary  Paresthesia is an abnormal burning or prickling sensation that is usually felt in the hands, arms, legs, or feet. It may also occur in other parts of the body.  Paresthesia may occur without any clear cause, or it may be caused by breathing too quickly (hyperventilation), pressure on a nerve, an underlying medical condition, side effects of a medication, nutritional deficiencies, or exposure to toxic chemicals.  If you have paresthesia that lasts a long time, you may need to be evaluated by your health care provider. This information is not intended to replace advice given to you by   your health care provider. Make sure you discuss any questions you have with your health care provider. Document Revised: 09/29/2018 Document Reviewed: 09/12/2017 Elsevier Patient Education  2020 [place].  Mole A mole is a colored (pigmented) growth on the skin. Moles are very common. They are usually harmless, but some moles can become cancerous over time. What are the causes? Moles are caused when pigmented skin cells grow together in  clusters instead of spreading out in the skin as they normally do. The reason why the skin cells grow together in clusters is not known. What increases the risk? You are more likely to develop a mole if you:  Have family members who have moles.  Are white.  Have blond hair.  Are often outdoors and exposed to the sun.  Received phototherapy when you were a newborn baby.  Are male. What are the signs or symptoms? A mole may be:  [place] or black.  Flat or raised.  Smooth or wrinkled. How is this diagnosed? A mole is diagnosed with a skin exam. If your health care provider thinks a mole may be cancerous, all or part of the mole will be removed for testing (biopsy). How is this treated? Most moles are noncancerous (benign) and do not require treatment. If a mole is found to be cancerous, it will be removed. You may also choose to have a mole removed if it is causing pain or if you do not like the way it looks. Follow these instructions at home: General instructions   Every month, look for new moles and check your existing moles for changes. This is important because a change in a mole can mean that the mole has become cancerous.  ABCDE changes in a mole indicate that you should be evaluated by your health care provider. ABCDE stands for: ? Asymmetry. This means the mole has an irregular shape. It is not round or oval. ? Border. This means the mole has an irregular or bumpy border. ? Color. This means the mole has multiple colors in it, including brown, black, blue, red, or tan. Note that it is normal for moles to get darker when a woman is pregnant or takes birth control pills. ? Diameter. This means the mole is more than 0.2 inches (6 mm) across. ? Evolving. This refers to any unusual changes or symptoms in the mole, such as pain, itching, stinging, sensitivity, or bleeding.  If you have a large number of moles, see a skin doctor (dermatologist) at least one time every year for a  full-body skin check. Lifestyle   When you are outdoors, wear sunscreen with SPF 30 (sun protection factor 30) or higher.  Use an adequate amount of sunscreen to cover exposed areas of skin. Put it on 30 minutes before you go out. Reapply it every 2 hours or anytime you come out of the water.  When you are out in the sun, wear a broad-brimmed hat and clothing that covers your arms and legs. Wear wraparound sunglasses. Contact a health care provider if:  The size, shape, borders, or color of your mole changes.  Your mole, or the skin near the mole, becomes painful, sore, red, or swollen.  Your mole: ? Develops more than one color. ? Itches or bleeds. ? Becomes scaly, sheds skin, or oozes fluid. ? Becomes flat or develops raised areas. ? Becomes hard or soft.  You develop a new mole. Summary  A mole is a colored (pigmented) growth on the skin. Moles  are very common. They are usually harmless, but some moles can become cancerous over time.  Every month, look for new moles and check your existing moles for changes. This is important because a change in a mole can mean that the mole has become cancerous.  If you have a large number of moles, see a skin doctor (dermatologist) at least one time every year for a full-body skin check.  When you are outdoors, wear sunscreen with SPF 30 (sun protection factor 30) or higher. Reapply it every 2 hours or anytime you come out of the water.  Contact a health care provider if you notice changes in a mole or if you develop a new mole. This information is not intended to replace advice given to you by your health care provider. Make sure you discuss any questions you have with your health care provider. Document Revised: 04/09/2019 Document Reviewed: 01/28/2018 Elsevier Patient Education  [place].  Insomnia Insomnia is a sleep disorder that makes it difficult to fall asleep or stay asleep. Insomnia can cause fatigue, low energy,  difficulty concentrating, mood swings, and poor performance at work or school. There are three different ways to classify insomnia:  Difficulty falling asleep.  Difficulty staying asleep.  Waking up too early in the morning. Any type of insomnia can be long-term (chronic) or short-term (acute). Both are common. Short-term insomnia usually lasts for three months or less. Chronic insomnia occurs at least three times a week for longer than three months. What are the causes? Insomnia may be caused by another condition, situation, or substance, such as:  Anxiety.  Certain medicines.  Gastroesophageal reflux disease (GERD) or other gastrointestinal conditions.  Asthma or other breathing conditions.  Restless legs syndrome, sleep apnea, or other sleep disorders.  Chronic pain.  Menopause.  Stroke.  Abuse of alcohol, tobacco, or illegal drugs.  Mental health conditions, such as depression.  Caffeine.  Neurological disorders, such as Alzheimer's disease.  An overactive thyroid (hyperthyroidism). Sometimes, the cause of insomnia may not be known. What increases the risk? Risk factors for insomnia include:  Gender. Women are affected more often than men.  Age. Insomnia is more common as you get older.  Stress.  Lack of exercise.  Irregular work schedule or working night shifts.  Traveling between different time zones.  Certain medical and mental health conditions. What are the signs or symptoms? If you have insomnia, the main symptom is having trouble falling asleep or having trouble staying asleep. This may lead to other symptoms, such as:  Feeling fatigued or having low energy.  Feeling nervous about going to sleep.  Not feeling rested in the morning.  Having trouble concentrating.  Feeling irritable, anxious, or depressed. How is this diagnosed? This condition may be diagnosed based on:  Your symptoms and medical history. Your health care provider may ask  about: ? Your sleep habits. ? Any medical conditions you have. ? Your mental health.  A physical exam. How is this treated? Treatment for insomnia depends on the cause. Treatment may focus on treating an underlying condition that is causing insomnia. Treatment may also include:  Medicines to help you sleep.  Counseling or therapy.  Lifestyle adjustments to help you sleep better. Follow these instructions at home: Eating and drinking   Limit or avoid alcohol, caffeinated beverages, and cigarettes, especially close to bedtime. These can disrupt your sleep.  Do not eat a large meal or eat spicy foods right before bedtime. This can lead to digestive  discomfort that can make it hard for you to sleep. Sleep habits   Keep a sleep diary to help you and your health care provider figure out what could be causing your insomnia. Write down: ? When you sleep. ? When you wake up during the night. ? How well you sleep. ? How rested you feel the next day. ? Any side effects of medicines you are taking. ? What you eat and drink.  Make your bedroom a dark, comfortable place where it is easy to fall asleep. ? Put up shades or blackout curtains to block light from outside. ? Use a white noise machine to block noise. ? Keep the temperature cool.  Limit screen use before bedtime. This includes: ? Watching TV. ? Using your smartphone, tablet, or computer.  Stick to a routine that includes going to bed and waking up at the same times every day and night. This can help you fall asleep faster. Consider making a quiet activity, such as reading, part of your nighttime routine.  Try to avoid taking naps during the day so that you sleep better at night.  Get out of bed if you are still awake after 15 minutes of trying to sleep. Keep the lights down, but try reading or doing a quiet activity. When you feel sleepy, go back to bed. General instructions  Take over-the-counter and prescription medicines  only as told by your health care provider.  Exercise regularly, as told by your health care provider. Avoid exercise starting several hours before bedtime.  Use relaxation techniques to manage stress. Ask your health care provider to suggest some techniques that may work well for you. These may include: ? Breathing exercises. ? Routines to release muscle tension. ? Visualizing peaceful scenes.  Make sure that you drive carefully. Avoid driving if you feel very sleepy.  Keep all follow-up visits as told by your health care provider. This is important. Contact a health care provider if:  You are tired throughout the day.  You have trouble in your daily routine due to sleepiness.  You continue to have sleep problems, or your sleep problems get worse. Get help right away if:  You have serious thoughts about hurting yourself or someone else. If you ever feel like you may hurt yourself or others, or have thoughts about taking your own life, get help right away. You can go to your nearest emergency department or call:  Your local emergency services (911 in the [place]  A suicide crisis helpline, such as the [facility] at 6816667221. This is open 24 hours a day. Summary  Insomnia is a sleep disorder that makes it difficult to fall asleep or stay asleep.  Insomnia can be long-term (chronic) or short-term (acute).  Treatment for insomnia depends on the cause. Treatment may focus on treating an underlying condition that is causing insomnia.  Keep a sleep diary to help you and your health care provider figure out what could be causing your insomnia. This information is not intended to replace advice given to you by your health care provider. Make sure you discuss any questions you have with your health care provider. Document Revised: 08/16/2017 Document Reviewed: 06/13/2017 Elsevier Patient Education  [place].  Erectile Dysfunction Erectile  dysfunction (ED) is the inability to get or keep an erection in order to have sexual intercourse. Erectile dysfunction may include:  Inability to get an erection.  Lack of enough hardness of the erection to allow penetration.  Loss  of the erection before sex is finished. What are the causes? This condition may be caused by:  Certain medicines, such as: ? Pain relievers. ? Antihistamines. ? Antidepressants. ? Blood pressure medicines. ? Water pills (diuretics). ? Ulcer medicines. ? Muscle relaxants. ? Drugs.  Excessive drinking.  Psychological causes, such as: ? Anxiety. ? Depression. ? Sadness. ? Exhaustion. ? Performance fear. ? Stress.  Physical causes, such as: ? Artery problems. This may include diabetes, smoking, liver disease, or atherosclerosis. ? High blood pressure. ? Hormonal problems, such as low testosterone. ? Obesity. ? Nerve problems. This may include back or pelvic injuries, diabetes mellitus, multiple sclerosis, or Parkinson disease. What are the signs or symptoms? Symptoms of this condition include:  Inability to get an erection.  Lack of enough hardness of the erection to allow penetration.  Loss of the erection before sex is finished.  Normal erections at some times, but with frequent unsatisfactory episodes.  Low sexual satisfaction in either partner due to erection problems.  A curved penis occurring with erection. The curve may cause pain or the penis may be too curved to allow for intercourse.  Never having nighttime erections. How is this diagnosed? This condition is often diagnosed by:  Performing a physical exam to find other diseases or specific problems with the penis.  Asking you detailed questions about the problem.  Performing blood tests to check for diabetes mellitus or to measure hormone levels.  Performing other tests to check for underlying health conditions.  Performing an ultrasound exam to check for  scarring.  Performing a test to check blood flow to the penis.  Doing a sleep study at home to measure nighttime erections. How is this treated? This condition may be treated by:  Medicine taken by mouth to help you achieve an erection (oral medicine).  Hormone replacement therapy to replace low testosterone levels.  Medicine that is injected into the penis. Your health care provider may instruct you how to give yourself these injections at home.  Vacuum pump. This is a pump with a ring on it. The pump and ring are placed on the penis and used to create pressure that helps the penis become erect.  Penile implant surgery. In this procedure, you may receive: ? An inflatable implant. This consists of cylinders, a pump, and a reservoir. The cylinders can be inflated with a fluid that helps to create an erection, and they can be deflated after intercourse. ? A semi-rigid implant. This consists of two silicone rubber rods. The rods provide some rigidity. They are also flexible, so the penis can both curve downward in its normal position and become straight for sexual intercourse.  Blood vessel surgery, to improve blood flow to the penis. During this procedure, a blood vessel from a different part of the body is placed into the penis to allow blood to flow around (bypass) damaged or blocked blood vessels.  Lifestyle changes, such as exercising more, losing weight, and quitting smoking. Follow these instructions at home: Medicines   Take over-the-counter and prescription medicines only as told by your health care provider. Do not increase the dosage without first discussing it with your health care provider.  If you are using self-injections, perform injections as directed by your health care provider. Make sure to avoid any veins that are on the surface of the penis. After giving an injection, apply pressure to the injection site for 5 minutes. General instructions  Exercise regularly, as  directed by your health care provider.  Work with your health care provider to lose weight, if needed.  Do not use any products that contain nicotine or tobacco, such as cigarettes and e-cigarettes. If you need help quitting, ask your health care provider.  Before using a vacuum pump, read the instructions that come with the pump and discuss any questions with your health care provider.  Keep all follow-up visits as told by your health care provider. This is important. Contact a health care provider if:  You feel nauseous.  You vomit. Get help right away if:  You are taking oral or injectable medicines and you have an erection that lasts longer than 4 hours. If your health care provider is unavailable, go to the nearest emergency room for evaluation. An erection that lasts much longer than 4 hours can result in permanent damage to your penis.  You have severe pain in your groin or abdomen.  You develop redness or severe swelling of your penis.  You have redness spreading up into your groin or lower abdomen.  You are unable to urinate.  You experience chest pain or a rapid heart beat (palpitations) after taking oral medicines. Summary  Erectile dysfunction (ED) is the inability to get or keep an erection during sexual intercourse. This problem can usually be treated successfully.  This condition is diagnosed based on a physical exam, your symptoms, and tests to determine the cause. Treatment varies depending on the cause, and may include medicines, hormone therapy, surgery, or vacuum pump.  You may need follow-up visits to make sure that you are using your medicines or devices correctly.  Get help right away if you are taking or injecting medicines and you have an erection that lasts longer than 4 hours. This information is not intended to replace advice given to you by your health care provider. Make sure you discuss any questions you have with your health care provider. Document  Revised: 08/16/2017 Document Reviewed: 09/19/2016 Elsevier Patient Education  [place].

## 2020-07-04 NOTE — Progress Notes (Signed)
paresSubjective:    Patient ID: Thomas Mahoney, male    DOB: 04-Dec-1975, 44 y.o.   MRN: [id number]  No chief complaint on file.   HPI Patient was seen today for ongoing concern.  Pt with LUE numbness and tingling radiating to fingers x several months.  Increasing in frequency.  Sensation starting to wake pt up at night.  Pt denies injury, edema in L hand, LUE.  Pt does some typing at work.  Had intermittent shooting pain in R jaw x 1 mo.  Vitamin B12 elevated 12/31/19.    Pt notes recent inability to achieve an erection with new partner.  Pt unsure if able to achieve erection by himself.  Denies changes in medications, foods, drinking EtOH, or using substances.  Endorses increased stress in new role at work.  Also notes insomnia.  Waking up in the middle of the night.    Pt has a bump on L upper thigh that has increased in size.  Present x a while.  Area is not pruritic, does not bleed, or cause irritation.    History reviewed. No pertinent past medical history.  Allergies  Allergen Reactions  . Codeine Hives  . Lactose Intolerance (Gi)     ROS General: Denies fever, chills, night sweats, changes in weight, changes in appetite HEENT: Denies headaches, ear pain, changes in vision, rhinorrhea, sore throat CV: Denies CP, palpitations, SOB, orthopnea Pulm: Denies SOB, cough, wheezing GI: Denies abdominal pain, nausea, vomiting, diarrhea, constipation GU: Denies dysuria, hematuria, frequency Msk: Denies muscle cramps, joint pains Neuro: Denies weakness  +numbness, tingling in LUE, intermittent shooting R jaw pain Skin: Denies rashes, bruising  +bump on L thigh Psych: Denies depression, anxiety, hallucinations     Objective:    Blood pressure 118/80, pulse (!) 55, temperature 98.2 F (36.8 C), temperature source Oral, weight 188 lb 3.2 oz (85.4 kg), SpO2 98 %.   Gen. Pleasant, well-nourished, in no distress, normal affect   HEENT: [place]/AT, face symmetric, conjunctiva clear, no  scleral icterus, PERRLA, EOMI, nares patent without drainage Neck: No JVD, no thyromegaly, no carotid bruits Lungs: no accessory muscle use Cardiovascular: RRR, no peripheral edema Abdomen: BS present, soft, NT/ND, no hepatosplenomegaly. Musculoskeletal: No asymmetry of bilateral shoulders.  No TTP of cervical spine, scapula, clavicles, wrist, or hands. FROM of b/l UEs.  Negative Tinel's, Phalen's, Neers, Hawkins, or symptoms with axial loading.  No cyanosis or clubbing, normal tone Neuro:  A&Ox3, CN II-XII intact, normal gait Skin:  Warm, dry, intact, no rash.  L medial upper thigh with 5 mm hyperpigmented nodule.  No erythema, induration, or drainage noted.   Wt Readings from Last 3 Encounters:  07/04/20 188 lb 3.2 oz (85.4 kg)  12/31/19 181 lb (82.1 kg)  10/27/18 184 lb 6.4 oz (83.6 kg)    Lab Results  Component Value Date   WBC 7.8 12/31/2019   HGB 15.6 12/31/2019   HCT 46.6 12/31/2019   PLT 239.0 12/31/2019   GLUCOSE 91 12/31/2019   CHOL 257 (H) 12/31/2019   TRIG 106.0 12/31/2019   HDL 46.60 12/31/2019   LDLCALC 190 (H) 12/31/2019   ALT 25 12/31/2019   AST 22 12/31/2019   NA 137 12/31/2019   K 4.6 12/31/2019   CL 101 12/31/2019   CREATININE 1.19 12/31/2019   BUN 11 12/31/2019   CO2 31 12/31/2019   TSH 0.71 12/31/2019   PSA 1.46 12/31/2019   HGBA1C 5.8 12/31/2019    Assessment/Plan:  Paresthesia  -concern for nerve  entrapment with radiculopathy given continued/worsening symptoms.  Less likely carpal tunnel based on exam. -vitamin B12 from 12/31/19 elevated at 1,044  -will obtain imaging with further recommendations based on results. - Plan: DG Cervical Spine Complete  Nevus -discussed options for removal. -consider referral to Derm.  Erectile dysfunction, unspecified erectile dysfunction type -discussed possible causes.  Likely 2/2 psychological etiology.   -pt to work on reducing stress and sleep hygiene -labs from 12/31/2019 reviewed including CBC, CMP,  lipids, vit B12, Hgb A1C, PSA, TSH.  B12 1,044,  tot cholesterol 257 and LDL 190 elevated. -will continue to monitor  Insomnia, unspecified type -Sleep hygiene -Self care encouraged and other ways to relieve stress such as exercise -Given handouts -Follow-up in 1 month  F/u prn in 1 month  Grier Mitts, MD

## 2020-07-07 DIAGNOSIS — R202 Paresthesia of skin: Secondary | ICD-10-CM | POA: Insufficient documentation

## 2020-07-08 DIAGNOSIS — M5412 Radiculopathy, cervical region: Secondary | ICD-10-CM | POA: Diagnosis not present

## 2020-07-18 ENCOUNTER — Other Ambulatory Visit: Payer: Self-pay | Admitting: Neurological Surgery

## 2020-07-18 DIAGNOSIS — M5412 Radiculopathy, cervical region: Secondary | ICD-10-CM

## 2020-08-04 ENCOUNTER — Ambulatory Visit
Admission: RE | Admit: 2020-08-04 | Discharge: 2020-08-04 | Disposition: A | Discharge status: Home / Self Care | Payer: BC Managed Care – PPO | Source: Ambulatory Visit | Attending: Neurological Surgery | Admitting: Neurological Surgery

## 2020-08-04 ENCOUNTER — Ambulatory Visit: Payer: BC Managed Care – PPO | Admitting: Family Medicine

## 2020-08-04 ENCOUNTER — Other Ambulatory Visit: Payer: Self-pay

## 2020-08-04 ENCOUNTER — Encounter: Payer: Self-pay | Admitting: Family Medicine

## 2020-08-04 VITALS — BP 108/76 | HR 65 | Temp 98.7°F | Wt 190.8 lb

## 2020-08-04 DIAGNOSIS — D229 Melanocytic nevi, unspecified: Secondary | ICD-10-CM

## 2020-08-04 DIAGNOSIS — F439 Reaction to severe stress, unspecified: Secondary | ICD-10-CM

## 2020-08-04 DIAGNOSIS — M5412 Radiculopathy, cervical region: Secondary | ICD-10-CM

## 2020-08-04 DIAGNOSIS — M4802 Spinal stenosis, cervical region: Secondary | ICD-10-CM | POA: Diagnosis not present

## 2020-08-04 NOTE — Progress Notes (Signed)
Subjective:    Patient ID: Thomas Mahoney, male    DOB: 04-08-1976, 44 y.o.   MRN: [id number]  No chief complaint on file.   HPI Patient was seen today for f/u.  Patient endorses increased stress at work.  Trying not to bring the stress from work home but at times affects sleep.  Patient is planning a trip out of town but considering canceling it 2/2 incurring unexpected car cost.  Pt unable to report any changes in ED symptoms 2/2 being busy with work.  Pt has MRI cervical spine for evaluation of radiculopathy scheduled for later this evening.  Pt wishes to have mole on left medial thigh removed this visit.  History reviewed. No pertinent past medical history.  Allergies  Allergen Reactions  . Codeine Hives  . Lactose Intolerance (Gi)     ROS General: Denies fever, chills, night sweats, changes in weight, changes in appetite HEENT: Denies headaches, ear pain, changes in vision, rhinorrhea, sore throat CV: Denies CP, palpitations, SOB, orthopnea Pulm: Denies SOB, cough, wheezing GI: Denies abdominal pain, nausea, vomiting, diarrhea, constipation GU: Denies dysuria, hematuria, frequency, vaginal discharge Msk: Denies muscle cramps, joint pains Neuro: Denies weakness, numbness, tingling Skin: Denies rashes, bruising  + nevus L medial thigh Psych: Denies depression, anxiety, hallucinations   + stress      Objective:    Blood pressure 108/76, pulse 65, temperature 98.7 F (37.1 C), temperature source Oral, weight 190 lb 12.8 oz (86.5 kg), SpO2 99 %.   Gen. Pleasant, well-nourished, in no distress, normal affect  HEENT: [place]/AT, face symmetric, conjunctiva clear, no scleral icterus, PERRLA, EOMI, nares patent without drainage Lungs: no accessory muscle use, CTAB, no wheezes or rales Cardiovascular: RRR, no m/r/g, no peripheral edema Musculoskeletal: No deformities, no cyanosis or clubbing, normal tone Neuro:  A&Ox3, CN II-XII intact, normal gait Skin:  Warm, no lesions/  rash   Wt Readings from Last 3 Encounters:  08/04/20 190 lb 12.8 oz (86.5 kg)  07/04/20 188 lb 3.2 oz (85.4 kg)  12/31/19 181 lb (82.1 kg)    Lab Results  Component Value Date   WBC 7.8 12/31/2019   HGB 15.6 12/31/2019   HCT 46.6 12/31/2019   PLT 239.0 12/31/2019   GLUCOSE 91 12/31/2019   CHOL 257 (H) 12/31/2019   TRIG 106.0 12/31/2019   HDL 46.60 12/31/2019   LDLCALC 190 (H) 12/31/2019   ALT 25 12/31/2019   AST 22 12/31/2019   NA 137 12/31/2019   K 4.6 12/31/2019   CL 101 12/31/2019   CREATININE 1.19 12/31/2019   BUN 11 12/31/2019   CO2 31 12/31/2019   TSH 0.71 12/31/2019   PSA 1.46 12/31/2019   HGBA1C 5.8 12/31/2019    Assessment/Plan:  Stress -Discussed self-care.  Patient encouraged to try working out regularly -Patient encouraged to take time off of work to go on previously planned vacation. -Discussed setting boundaries. -Consider counseling.  Discussed various counseling options. -We will continue to monitor  Nevus -Discussed return for removal later this afternoon  F/u later today if possible for nevus removal.  Follow-up in the next 1-2 months for stress.  Update: Patient was unable to return to clinic in the afternoon of 08/04/2020.  Reschedule for next week for mole removal  Grier Mitts, MD

## 2020-08-05 ENCOUNTER — Encounter: Payer: Self-pay | Admitting: Family Medicine

## 2020-08-08 ENCOUNTER — Other Ambulatory Visit: Payer: Self-pay

## 2020-08-08 ENCOUNTER — Encounter: Payer: Self-pay | Admitting: Family Medicine

## 2020-08-08 ENCOUNTER — Ambulatory Visit (INDEPENDENT_AMBULATORY_CARE_PROVIDER_SITE_OTHER): Payer: BC Managed Care – PPO | Admitting: Family Medicine

## 2020-08-08 VITALS — BP 110/80 | HR 74 | Temp 98.5°F | Wt 192.0 lb

## 2020-08-08 DIAGNOSIS — D229 Melanocytic nevi, unspecified: Secondary | ICD-10-CM

## 2020-08-08 DIAGNOSIS — L720 Epidermal cyst: Secondary | ICD-10-CM

## 2020-08-08 NOTE — Progress Notes (Signed)
Subjective:    Patient ID: Thomas Mahoney, male    DOB: September 19, 1975, 44 y.o.   MRN: [id number]  No chief complaint on file.   HPI Patient was seen today for removal of nevus from L inner thigh.  Area present x yrs with recent increase in size.  Slightly raised, nontender, without drainage, pruritis, erythema, or induration.  Slight color change.  No past medical history on file.  Allergies  Allergen Reactions  . Codeine Hives  . Lactose Intolerance (Gi)     ROS General: Denies fever, chills, night sweats, changes in weight, changes in appetite HEENT: Denies headaches, ear pain, changes in vision, rhinorrhea, sore throat CV: Denies CP, palpitations, SOB, orthopnea Pulm: Denies SOB, cough, wheezing GI: Denies abdominal pain, nausea, vomiting, diarrhea, constipation GU: Denies dysuria, hematuria, frequency Msk: Denies muscle cramps, joint pains Neuro: Denies weakness, numbness, tingling Skin: Denies rashes, bruising  +nevus L medial thigh Psych: Denies depression, anxiety, hallucinations      Objective:    Blood pressure 110/80, pulse 74, temperature 98.5 F (36.9 C), temperature source Oral, weight 192 lb (87.1 kg), SpO2 98 %.  Gen. Pleasant, well-nourished, in no distress, normal affect   HEENT: [place]/AT, face symmetric, conjunctiva clear, no scleral icterus, PERRLA, EOMI, nares patent without drainage Lungs: no accessory muscle use Cardiovascular: RRR, no peripheral edema Musculoskeletal: No deformities, no cyanosis or clubbing, normal tone Neuro:  A&Ox3, CN II-XII intact, normal gait Skin:  Warm,dry, intact.  4 mm rounded, slightly raised nevus on L medial thigh.  Shave biopsy performed.   Wt Readings from Last 3 Encounters:  08/04/20 190 lb 12.8 oz (86.5 kg)  07/04/20 188 lb 3.2 oz (85.4 kg)  12/31/19 181 lb (82.1 kg)    Lab Results  Component Value Date   WBC 7.8 12/31/2019   HGB 15.6 12/31/2019   HCT 46.6 12/31/2019   PLT 239.0 12/31/2019   GLUCOSE 91  12/31/2019   CHOL 257 (H) 12/31/2019   TRIG 106.0 12/31/2019   HDL 46.60 12/31/2019   LDLCALC 190 (H) 12/31/2019   ALT 25 12/31/2019   AST 22 12/31/2019   NA 137 12/31/2019   K 4.6 12/31/2019   CL 101 12/31/2019   CREATININE 1.19 12/31/2019   BUN 11 12/31/2019   CO2 31 12/31/2019   TSH 0.71 12/31/2019   PSA 1.46 12/31/2019   HGBA1C 5.8 12/31/2019   Shave Biopsy Procedure Note  Pre-operative Diagnosis: nevus  Post-operative Diagnosis: normal  Locations:left, medial, thigh  Anesthesia: Lidocaine 1% without epinephrine without added sodium bicarbonate  Procedure Details  History of allergy to iodine: no  Patient informed of the risks (including bleeding and infection) and benefits of the  procedure and Verbal informed consent obtained.  The lesion and surrounding area were given a sterile prep using alcohol and betadyne and draped in the usual sterile fashion. A scalpel was used to shave an area of skin approximately 62mm by 40mm.  Hemostasis achieved with silver nitrate. Antibiotic ointment and a sterile dressing applied.  The specimen was sent for pathologic examination.  Skin muscle on superior edge of specimen. The patient tolerated the procedure well.  EBL: minimal cc's  Condition: Stable  Complications: none.   Assessment/Plan:  Nevus  -consent obtained.  Shave biopsy performed.  Pt tolerated procedure well. -Keep wound dry and covered for 24-48 hours and clean thereafter -Given precautions and discussed warning signs of infection -NSAIDs as needed or Tylenol for any pain or discomfort -given handouts -Specimen sent for pathology -  Plan: Dermatology pathology  F/u prn  Grier Mitts, MD

## 2020-08-08 NOTE — Patient Instructions (Signed)
Skin Biopsy A skin biopsy is a procedure to remove a sample of your skin (lesion) so that it can be checked under a microscope. You may need a skin biopsy if you have a skin disease or abnormal changes in your skin. Tell a health care provider about:  Any allergies you have.  All medicines you are taking, including vitamins, herbs, eye drops, creams, and over-the-counter medicines.  Any problems you or family members have had with anesthetic medicines.  Any blood disorders you have.  Any surgeries you have had.  Any medical conditions you have or have had.  Whether you are pregnant or may be pregnant. What are the risks? Generally, this is a safe procedure. However, problems may occur, including:  Infection.  Bleeding.  Allergic reaction to medicines.  Scarring. What happens before the procedure?  Ask your health care provider about: ? Changing or stopping your regular medicines. This is especially important if you are taking blood thinners. ? Taking medicines such as aspirin and ibuprofen. These medicines can thin your blood. Do not take these medicines unless your health care provider tells you to take them. ? Taking over-the-counter medicines, vitamins, herbs, and supplements.  Ask your health care provider if you will need someone to take you home from the hospital or clinic after the procedure.  Ask your health care provider how your biopsy site will be marked or identified.  Ask your health care provider what steps will be taken to help prevent infection. These may include: ? Removing hair at the surgery site. ? Washing skin with a germ-killing soap. ? Taking antibiotic medicine. What happens during the procedure?   You may be given medicine to numb the area (local anesthetic).  Your health care provider will take a sample using one of these steps, depending on the type of skin problem that you have: ? Shave biopsy. Your health care provider will shave away  layers of your skin lesion with a sharp blade. After shaving, a gel or ointment may be used to control bleeding. ? Punch biopsy. Your health care provider will use a tool to remove all or part of the lesion. This leaves a small hole about the width of a pencil eraser. The area may be covered with a gel or ointment. ? Excisional or incisional biopsy. Your health care provider will use a surgical blade to remove all or part of your lesion.  Your skin biopsy site may be closed with stitches (sutures).  A bandage (dressing) will be applied. The procedure may vary among health care providers and hospitals. What happens after the procedure?  Your skin sample will be sent to a lab for tests.  Your skin biopsy site will be watched to make sure that it stops bleeding.  You will be given instructions on how to care for your biopsy site.  It is up to you to get the results of your procedure. Ask your health care provider, or the department that is doing the procedure, when your results will be ready. Summary  A skin biopsy is a procedure to remove a sample of your skin (lesion) so that it can be checked under a microscope.  Tell a health care provider about your medical history and all medicines you are taking, including vitamins, herbs, eye drops, creams, and over-the-counter medicines.  Before the procedure, ask your health care provider about changing or stopping your regular medicines.  During the procedure, your health care provider will take a skin sample from  the area where you have the skin problem.  After the procedure, your skin sample will be sent to a laboratory for testing. This information is not intended to replace advice given to you by your health care provider. Make sure you discuss any questions you have with your health care provider. Document Revised: 03/03/2018 Document Reviewed: 03/03/2018 Elsevier Patient Education  [place].  Skin Biopsy, Care After This sheet  gives you information about how to care for yourself after your procedure. Your health care provider may also give you more specific instructions. If you have problems or questions, contact your health care provider. What can I expect after the procedure? After the procedure, it is common to have:  Soreness.  Bruising.  Itching. Follow these instructions at home: Biopsy site care Follow instructions from your health care provider about how to take care of your biopsy site. Make sure you:  Wash your hands with soap and water before and after you change your bandage (dressing). If soap and water are not available, use hand sanitizer.  Apply ointment on your biopsy site as directed by your health care provider.  Change your dressing as told by your health care provider.  Leave stitches (sutures), skin glue, or adhesive strips in place. These skin closures may need to stay in place for 2 weeks or longer. If adhesive strip edges start to loosen and curl up, you may trim the loose edges. Do not remove adhesive strips completely unless your health care provider tells you to do that.  If the biopsy area bleeds, apply gentle pressure for 10 minutes. Check your biopsy site every day for signs of infection. Check for:  Redness, swelling, or pain.  Fluid or blood.  Warmth.  Pus or a bad smell.  General instructions  Rest and then return to your normal activities as told by your health care provider.  Take over-the-counter and prescription medicines only as told by your health care provider.  Keep all follow-up visits as told by your health care provider. This is important. Contact a health care provider if:  You have redness, swelling, or pain around your biopsy site.  You have fluid or blood coming from your biopsy site.  Your biopsy site feels warm to the touch.  You have pus or a bad smell coming from your biopsy site.  You have a fever.  Your sutures, skin glue, or adhesive  strips loosen or come off sooner than expected. Get help right away if:  You have bleeding that does not stop with pressure or a dressing. Summary  After the procedure, it is common to have soreness, bruising, and itching at the site.  Follow instructions from your health care provider about how to take care of your biopsy site.  Check your biopsy site every day for signs of infection.  Contact a health care provider if you have redness, swelling, or pain around your biopsy site, or your biopsy site feels warm to the touch.  Keep all follow-up visits as told by your health care provider. This is important. This information is not intended to replace advice given to you by your health care provider. Make sure you discuss any questions you have with your health care provider. Document Revised: 03/03/2018 Document Reviewed: 03/03/2018 Elsevier Patient Education  [place].

## 2020-09-05 DIAGNOSIS — M79661 Pain in right lower leg: Secondary | ICD-10-CM | POA: Diagnosis not present

## 2020-09-05 DIAGNOSIS — S86811A Strain of other muscle(s) and tendon(s) at lower leg level, right leg, initial encounter: Secondary | ICD-10-CM | POA: Diagnosis not present

## 2020-09-05 DIAGNOSIS — R200 Anesthesia of skin: Secondary | ICD-10-CM | POA: Diagnosis not present

## 2021-01-02 ENCOUNTER — Other Ambulatory Visit: Payer: Self-pay

## 2021-01-02 ENCOUNTER — Encounter: Payer: Self-pay | Admitting: Family Medicine

## 2021-01-02 ENCOUNTER — Ambulatory Visit (INDEPENDENT_AMBULATORY_CARE_PROVIDER_SITE_OTHER): Payer: BC Managed Care – PPO | Admitting: Family Medicine

## 2021-01-02 VITALS — BP 116/80 | HR 98 | Temp 98.2°F | Wt 190.0 lb

## 2021-01-02 DIAGNOSIS — E782 Mixed hyperlipidemia: Secondary | ICD-10-CM

## 2021-01-02 DIAGNOSIS — Z113 Encounter for screening for infections with a predominantly sexual mode of transmission: Secondary | ICD-10-CM | POA: Diagnosis not present

## 2021-01-02 DIAGNOSIS — Z0000 Encounter for general adult medical examination without abnormal findings: Secondary | ICD-10-CM | POA: Diagnosis not present

## 2021-01-02 DIAGNOSIS — Z1159 Encounter for screening for other viral diseases: Secondary | ICD-10-CM

## 2021-01-02 DIAGNOSIS — R7303 Prediabetes: Secondary | ICD-10-CM | POA: Diagnosis not present

## 2021-01-02 DIAGNOSIS — Z125 Encounter for screening for malignant neoplasm of prostate: Secondary | ICD-10-CM

## 2021-01-02 DIAGNOSIS — Z1211 Encounter for screening for malignant neoplasm of colon: Secondary | ICD-10-CM

## 2021-01-02 LAB — VITAMIN D 25 HYDROXY (VIT D DEFICIENCY, FRACTURES): VITD: 99.56 ng/mL (ref 30.00–100.00)

## 2021-01-02 LAB — LIPID PANEL
Cholesterol: 262 mg/dL — ABNORMAL HIGH (ref 0–200)
HDL: 41.9 mg/dL (ref >39.00)
LDL Cholesterol: 195 mg/dL — ABNORMAL HIGH (ref 0–99)
NonHDL: 220.24
Total CHOL/HDL Ratio: 6
Triglycerides: 126 mg/dL (ref 0.0–149.0)
VLDL: 25.2 mg/dL (ref 0.0–40.0)

## 2021-01-02 LAB — CBC WITH DIFFERENTIAL/PLATELET
Basophils Absolute: 0 10*3/uL (ref 0.0–0.1)
Basophils Relative: 0.6 % (ref 0.0–3.0)
Eosinophils Absolute: 0.1 10*3/uL (ref 0.0–0.7)
Eosinophils Relative: 1.9 % (ref 0.0–5.0)
HCT: 47.3 % (ref 39.0–52.0)
Hemoglobin: 16.1 g/dL (ref 13.0–17.0)
Lymphocytes Relative: 24.6 % (ref 12.0–46.0)
Lymphs Abs: 1.9 10*3/uL (ref 0.7–4.0)
MCHC: 34.1 g/dL (ref 30.0–36.0)
MCV: 93.3 fl (ref 78.0–100.0)
Monocytes Absolute: 0.6 10*3/uL (ref 0.1–1.0)
Monocytes Relative: 7.9 % (ref 3.0–12.0)
Neutro Abs: 5 10*3/uL (ref 1.4–7.7)
Neutrophils Relative %: 65 % (ref 43.0–77.0)
Platelets: 265 10*3/uL (ref 150.0–400.0)
RBC: 5.07 Mil/uL (ref 4.22–5.81)
RDW: 12.6 % (ref 11.5–15.5)
WBC: 7.6 10*3/uL (ref 4.0–10.5)

## 2021-01-02 LAB — COMPREHENSIVE METABOLIC PANEL
ALT: 16 U/L (ref 0–53)
AST: 18 U/L (ref 0–37)
Albumin: 4.5 g/dL (ref 3.5–5.2)
Alkaline Phosphatase: 65 U/L (ref 39–117)
BUN: 13 mg/dL (ref 6–23)
CO2: 29 mEq/L (ref 19–32)
Calcium: 10.1 mg/dL (ref 8.4–10.5)
Chloride: 101 mEq/L (ref 96–112)
Creatinine, Ser: 1.4 mg/dL (ref 0.40–1.50)
GFR: 60.4 mL/min (ref >60.00)
Glucose, Bld: 101 mg/dL — ABNORMAL HIGH (ref 70–99)
Potassium: 4 mEq/L (ref 3.5–5.1)
Sodium: 138 mEq/L (ref 135–145)
Total Bilirubin: 0.5 mg/dL (ref 0.2–1.2)
Total Protein: 8 g/dL (ref 6.0–8.3)

## 2021-01-02 LAB — PSA: PSA: 1.54 ng/mL (ref 0.10–4.00)

## 2021-01-02 LAB — TSH: TSH: 0.81 u[IU]/mL (ref 0.35–4.50)

## 2021-01-02 LAB — VITAMIN B12: Vitamin B-12: 1185 pg/mL — ABNORMAL HIGH (ref 211–911)

## 2021-01-02 LAB — HEMOGLOBIN A1C: Hgb A1c MFr Bld: 6 % (ref 4.6–6.5)

## 2021-01-02 LAB — T4, FREE: Free T4: 0.85 ng/dL (ref 0.60–1.60)

## 2021-01-02 NOTE — Patient Instructions (Signed)
Preventive Care 40-46 Years Old, Male Preventive care refers to lifestyle choices and visits with your health care provider that can promote health and wellness. This includes:  A yearly physical exam. This is also called an annual wellness visit.  Regular dental and eye exams.  Immunizations.  Screening for certain conditions.  Healthy lifestyle choices, such as: ? Eating a healthy diet. ? Getting regular exercise. ? Not using drugs or products that contain nicotine and tobacco. ? Limiting alcohol use. What can I expect for my preventive care visit? Physical exam Your health care provider will check your:  Height and weight. These may be used to calculate your BMI (body mass index). BMI is a measurement that tells if you are at a healthy weight.  Heart rate and blood pressure.  Body temperature.  Skin for abnormal spots. Counseling Your health care provider may ask you questions about your:  Past medical problems.  Family's medical history.  Alcohol, tobacco, and drug use.  Emotional well-being.  Home life and relationship well-being.  Sexual activity.  Diet, exercise, and sleep habits.  Work and work environment.  Access to firearms. What immunizations do I need? Vaccines are usually given at various ages, according to a schedule. Your health care provider will recommend vaccines for you based on your age, medical history, and lifestyle or other factors, such as travel or where you work.   What tests do I need? Blood tests  Lipid and cholesterol levels. These may be checked every 5 years, or more often if you are over 50 years old.  Hepatitis C test.  Hepatitis B test. Screening  Lung cancer screening. You may have this screening every year starting at age 55 if you have a 30-pack-year history of smoking and currently smoke or have quit within the past 15 years.  Prostate cancer screening. Recommendations will vary depending on your family history and  other risks.  Genital exam to check for testicular cancer or hernias.  Colorectal cancer screening. ? All adults should have this screening starting at age 50 and continuing until age 75. ? Your health care provider may recommend screening at age 45 if you are at increased risk. ? You will have tests every 1-10 years, depending on your results and the type of screening test.  Diabetes screening. ? This is done by checking your blood sugar (glucose) after you have not eaten for a while (fasting). ? You may have this done every 1-3 years.  STD (sexually transmitted disease) testing, if you are at risk. Follow these instructions at home: Eating and drinking  Eat a diet that includes fresh fruits and vegetables, whole grains, lean protein, and low-fat dairy products.  Take vitamin and mineral supplements as recommended by your health care provider.  Do not drink alcohol if your health care provider tells you not to drink.  If you drink alcohol: ? Limit how much you have to 0-2 drinks a day. ? Be aware of how much alcohol is in your drink. In the [place] one drink equals one 12 oz bottle of beer (355 mL), one 5 oz glass of wine (148 mL), or one 1 oz glass of hard liquor (44 mL).   Lifestyle  Take daily care of your teeth and gums. Brush your teeth every morning and night with fluoride toothpaste. Floss one time each day.  Stay active. Exercise for at least 30 minutes 5 or more days each week.  Do not use any products that contain nicotine or   tobacco, such as cigarettes, e-cigarettes, and chewing tobacco. If you need help quitting, ask your health care provider.  Do not use drugs.  If you are sexually active, practice safe sex. Use a condom or other form of protection to prevent STIs (sexually transmitted infections).  If told by your health care provider, take low-dose aspirin daily starting at age 78.  Find healthy ways to cope with stress, such as: ? Meditation, yoga, or  listening to music. ? Journaling. ? Talking to a trusted person. ? Spending time with friends and family. Safety  Always wear your seat belt while driving or riding in a vehicle.  Do not drive: ? If you have been drinking alcohol. Do not ride with someone who has been drinking. ? When you are tired or distracted. ? While texting.  Wear a helmet and other protective equipment during sports activities.  If you have firearms in your house, make sure you follow all gun safety procedures. What's next?  Go to your health care provider once a year for an annual wellness visit.  Ask your health care provider how often you should have your eyes and teeth checked.  Stay up to date on all vaccines. This information is not intended to replace advice given to you by your health care provider. Make sure you discuss any questions you have with your health care provider. Document Revised: 06/02/2019 Document Reviewed: 08/28/2018 Elsevier Patient Education  2021 [employer].  https://www.cancer.org/cancer/colon-rectal-cancer/causes-risks-prevention/risk-factors.html">  Colorectal Cancer Screening  Colorectal cancer screening is a group of tests that are used to check for colorectal cancer before symptoms develop. Colorectal refers to the colon and rectum. The colon and rectum are located at the end of the digestive tract and carry stool (feces) out of the body. Who should have screening? All adults who are 20-105 years old should have screening. Your health care provider may recommend screening before age 32. You will have tests every 1-10 years, depending on your results and the type of screening test. Screening recommendations for adults who are 27-28 years old vary depending on a person's health. People older than age 73 should no longer get colorectal cancer screening. You may have screening tests starting before age 92, or more often than other people, if you have any of these risk  factors:  A personal or family history of colorectal cancer or abnormal growths known as polyps in your colon.  Inflammatory bowel disease, such as ulcerative colitis or Crohn's disease.  A history of having radiation treatment to the abdomen or the area between the hip bones (pelvic area) for cancer.  A type of genetic syndrome that is passed from parent to child (hereditary), such as: ? Lynch syndrome. ? Familial adenomatous polyposis. ? Turcot syndrome. ? Peutz-Jeghers syndrome. ? MUTYH-associated polyposis (MAP).  A personal history of diabetes. Types of tests There are several types of colorectal screening tests. You may have one or more of the following:  Guaiac-based fecal occult blood testing. For this test, a stool sample is checked for hidden (occult) blood, which could be a sign of colorectal cancer.  Fecal immunochemical test (FIT). For this test, a stool sample is checked for blood, which could be a sign of colorectal cancer.  Stool DNA test. For this test, a stool sample is checked for blood and changes in DNA that could lead to colorectal cancer.  Sigmoidoscopy. During this test, a thin, flexible tube with a camera on the end, called a sigmoidoscope, is used to examine the  rectum and the lower colon.  Colonoscopy. During this test, a long, flexible tube with a camera on the end, called a colonoscope, is used to examine the entire colon and rectum. Also, sometimes a tissue sample is taken to be looked at under a microscope (biopsy) or small polyps are removed during this test.  Virtual colonoscopy. Instead of a colonoscope, this type of colonoscopy uses a CT scan to take pictures of the colon and rectum. A CT scan is a type of X-ray that is made using computers. What are the benefits of screening? Screening reduces your risk for colorectal cancer and can help identify cancer at an early stage, when the cancer can be removed or treated more easily. It is common for polyps  to form in the lining of the colon, especially as you age. These polyps may be cancerous or become cancerous over time. Screening can identify these polyps. What are the risks of screening? Generally, these are safe tests. However, problems may occur, including:  The need for more tests to confirm results from a stool sample test. Stool sample tests have fewer risks than other types of screening tests.  Being exposed to low levels of radiation, if you had a test involving X-rays. This may slightly increase your cancer risk. The benefit of detecting cancer outweighs the slight increase in risk.  Bleeding, damage to the intestine, or infection caused by a sigmoidoscopy or colonoscopy.  A reaction to medicines given during a sigmoidoscopy or colonoscopy. Talk with your health care provider to understand your risk for colorectal cancer and to make a screening plan that is right for you. Questions to ask your health care provider  When should I start colorectal cancer screening?  What is my risk for colorectal cancer?  How often do I need screening?  Which screening tests do I need?  How do I get my test results?  What do my results mean? Where to find more information Learn more about colorectal cancer screening from:  The American Cancer Society: cancer.[place]: cancer.gov Summary  Colorectal cancer screening is a group of tests used to check for colorectal cancer before symptoms develop.  All adults who are 60-9 years old should have screening. Your health care provider may recommend screening before age 51.  You may have screening tests starting before age 26, or more often than other people, if you have certain risk factors.  Screening reduces your risk for colorectal cancer and can help identify cancer at an early stage, when the cancer can be removed or treated more easily.  Talk with your health care provider to understand your risk for colorectal  cancer and to make a screening plan that is right for you. This information is not intended to replace advice given to you by your health care provider. Make sure you discuss any questions you have with your health care provider. Document Revised: 12/23/2019 Document Reviewed: 12/23/2019 Elsevier Patient Education  2021 [employer].

## 2021-01-02 NOTE — Progress Notes (Signed)
Subjective:     Thomas Mahoney is a 45 y.o. male and is here for a comprehensive physical exam. The patient reports no problems.  Patient states he is doing well overall.  Currently working in [place], [place].  Endorses having a colonoscopy at age 61.  States since last OFV strained his calf walking.  Still skateboarding.  Social History   Socioeconomic History  . Marital status: Unknown    Spouse name: Not on file  . Number of children: 3  . Years of education: Not on file  . Highest education level: Not on file  Occupational History  . Not on file  Tobacco Use  . Smoking status: Never Smoker  . Smokeless tobacco: Never Used  Substance and Sexual Activity  . Alcohol use: Never  . Drug use: Not on file  . Sexual activity: Not on file  Other Topics Concern  . Not on file  Social History Narrative  . Not on file   Social Determinants of Health   Financial Resource Strain: Not on file  Food Insecurity: Not on file  Transportation Needs: Not on file  Physical Activity: Not on file  Stress: Not on file  Social Connections: Not on file  Intimate Partner Violence: Not on file   Health Maintenance  Topic Date Due  . Hepatitis C Screening  Never done  . TETANUS/TDAP  Never done  . COLONOSCOPY (Pts 45-41yrs Insurance coverage will need to be confirmed)  Never done  . INFLUENZA VACCINE  04/17/2021  . COVID-19 Vaccine  Completed  . HIV Screening  Completed  . HPV VACCINES  Aged Out    The following portions of the patient's history were reviewed and updated as appropriate: allergies, current medications, past family history, past medical history, past social history, past surgical history and problem list.  Review of Systems A comprehensive review of systems was negative.   Objective:    BP 116/80 (BP Location: Right Arm, Patient Position: Sitting, Cuff Size: Normal)   Pulse 98   Temp 98.2 F (36.8 C) (Oral)   Wt 190 lb (86.2 kg)   SpO2 97%   BMI 25.77 kg/m  General  appearance: alert, cooperative and no distress Head: Normocephalic, without obvious abnormality, atraumatic Eyes: conjunctivae/corneas clear. PERRL, EOM's intact. Fundi benign. Ears: normal TM's and external ear canals both ears Nose: Nares normal. Septum midline. Mucosa normal. No drainage or sinus tenderness. Throat: lips, mucosa, and tongue normal; teeth and gums normal Neck: no adenopathy, no carotid bruit, no JVD, supple, symmetrical, trachea midline and thyroid not enlarged, symmetric, no tenderness/mass/nodules Lungs: clear to auscultation bilaterally Heart: regular rate and rhythm, S1, S2 normal, no murmur, click, rub or gallop Abdomen: soft, non-tender; bowel sounds normal; no masses,  no organomegaly Extremities: extremities normal, atraumatic, no cyanosis or edema Pulses: 2+ and symmetric Skin: Skin color, texture, turgor normal. No rashes or lesions Lymph nodes: Cervical, supraclavicular, and axillary nodes normal. Neurologic: Alert and oriented X 3, normal strength and tone. Normal symmetric reflexes. Normal coordination and gait    Assessment:    Healthy male exam.      Plan:     Anticipatory guidance given including wearing seatbelts, smoke detectors in the home, increasing physical activity, increasing p.o. intake of water and vegetables. -Will obtain labs -Given handout -Next CPE in 1 year See After Visit Summary for Counseling Recommendations    Mixed hyperlipidemia - Plan: Lipid panel  Prediabetes -Hemoglobin A1c 5.8% on 12/31/2019  - Plan: Hemoglobin A1c  Routine screening for STI (sexually transmitted infection)  - Plan: RPR, HIV antibody (with reflex), C. trachomatis/N. gonorrhoeae RNA  Colon cancer screening  - Plan: Ambulatory referral to Gastroenterology  Encounter for hepatitis C screening test for low risk patient  - Plan: Hep C Antibody  Screening for prostate cancer  - Plan: PSA  Follow-up.  Grier Mitts, MD

## 2021-01-03 LAB — C. TRACHOMATIS/N. GONORRHOEAE RNA
C. trachomatis RNA, TMA: NOT DETECTED
N. gonorrhoeae RNA, TMA: NOT DETECTED

## 2021-01-03 LAB — HEPATITIS C ANTIBODY
Hepatitis C Ab: NONREACTIVE
SIGNAL TO CUT-OFF: 0.01 (ref <1.00)

## 2021-01-03 LAB — RPR: RPR Ser Ql: NONREACTIVE

## 2021-01-03 LAB — HIV ANTIBODY (ROUTINE TESTING W REFLEX): HIV 1&2 Ab, 4th Generation: NONREACTIVE

## 2021-01-06 NOTE — Progress Notes (Signed)
ATC patient, no answer, per FYI left detailed vm  about lab results.

## 2021-01-16 ENCOUNTER — Encounter: Payer: Self-pay | Admitting: Gastroenterology

## 2021-01-18 ENCOUNTER — Ambulatory Visit (AMBULATORY_SURGERY_CENTER): Payer: BC Managed Care – PPO

## 2021-01-18 ENCOUNTER — Other Ambulatory Visit: Payer: Self-pay

## 2021-01-18 VITALS — Ht 72.0 in | Wt 187.0 lb

## 2021-01-18 DIAGNOSIS — Z1211 Encounter for screening for malignant neoplasm of colon: Secondary | ICD-10-CM

## 2021-01-18 MED ORDER — SUTAB 1479-225-188 MG PO TABS: 1.0000 | ORAL_TABLET | ORAL | 0 refills | Status: DC

## 2021-01-18 NOTE — Progress Notes (Signed)
Pre visit completed via phone call;  Patient verified name, DOB, and address; No egg or soy allergy known to patient  No issues with past sedation with any surgeries or procedures Patient denies ever being told they had issues or difficulty with intubation  No FH of Malignant Hyperthermia No diet pills per patient No home 02 use per patient  No blood thinners per patient  Pt denies issues with constipation  No A fib or A flutter  EMMI video via MyChart  COVID 19 guidelines implemented in PV today with Pt and RN   Pt is fully vaccinated for Covid x 2 + booster;  Coupon given to pt in PV today, Code to Pharmacy and NO PA's for preps discussed with pt in PV today  Discussed with pt there will be an out-of-pocket cost for prep and that varies from $0 to 70 +  dollars   Due to the COVID-19 pandemic we are asking patients to follow certain guidelines.  Pt aware of COVID protocols and [facility] guidelines    

## 2021-01-27 ENCOUNTER — Encounter: Payer: Self-pay | Admitting: Gastroenterology

## 2021-02-01 ENCOUNTER — Other Ambulatory Visit: Payer: Self-pay

## 2021-02-01 ENCOUNTER — Encounter: Payer: Self-pay | Admitting: Gastroenterology

## 2021-02-01 ENCOUNTER — Ambulatory Visit (AMBULATORY_SURGERY_CENTER): Payer: BC Managed Care – PPO | Admitting: Gastroenterology

## 2021-02-01 VITALS — BP 112/70 | HR 70 | Temp 97.7°F | Resp 18 | Ht 72.0 in | Wt 187.0 lb

## 2021-02-01 DIAGNOSIS — Z1211 Encounter for screening for malignant neoplasm of colon: Secondary | ICD-10-CM | POA: Diagnosis not present

## 2021-02-01 DIAGNOSIS — D120 Benign neoplasm of cecum: Secondary | ICD-10-CM

## 2021-02-01 DIAGNOSIS — D125 Benign neoplasm of sigmoid colon: Secondary | ICD-10-CM

## 2021-02-01 DIAGNOSIS — D127 Benign neoplasm of rectosigmoid junction: Secondary | ICD-10-CM | POA: Diagnosis not present

## 2021-02-01 DIAGNOSIS — D123 Benign neoplasm of transverse colon: Secondary | ICD-10-CM

## 2021-02-01 DIAGNOSIS — D128 Benign neoplasm of rectum: Secondary | ICD-10-CM | POA: Diagnosis not present

## 2021-02-01 DIAGNOSIS — K635 Polyp of colon: Secondary | ICD-10-CM

## 2021-02-01 MED ORDER — SODIUM CHLORIDE 0.9 % IV SOLN: 500.0000 mL | Freq: Once | INTRAVENOUS | Status: DC

## 2021-02-01 NOTE — Progress Notes (Signed)
[facility]  Pt's states no medical or surgical changes since previsit or office visit.  

## 2021-02-01 NOTE — Patient Instructions (Addendum)
Please read handouts provided. Continue present medications. Await pathology results.   YOU HAD AN ENDOSCOPIC PROCEDURE TODAY AT THE [facility]:   Refer to the procedure report that was given to you for any specific questions about what was found during the examination.  If the procedure report does not answer your questions, please call your gastroenterologist to clarify.  If you requested that your care partner not be given the details of your procedure findings, then the procedure report has been included in a sealed envelope for you to review at your convenience later.  YOU SHOULD EXPECT: Some feelings of bloating in the abdomen. Passage of more gas than usual.  Walking can help get rid of the air that was put into your GI tract during the procedure and reduce the bloating. If you had a lower endoscopy (such as a colonoscopy or flexible sigmoidoscopy) you may notice spotting of blood in your stool or on the toilet paper. If you underwent a bowel prep for your procedure, you may not have a normal bowel movement for a few days.  Please Note:  You might notice some irritation and congestion in your nose or some drainage.  This is from the oxygen used during your procedure.  There is no need for concern and it should clear up in a day or so.  SYMPTOMS TO REPORT IMMEDIATELY:  Following lower endoscopy (colonoscopy or flexible sigmoidoscopy):  Excessive amounts of blood in the stool  Significant tenderness or worsening of abdominal pains  Swelling of the abdomen that is new, acute  Fever of 100F or higher   For urgent or emergent issues, a gastroenterologist can be reached at any hour by calling [phone]. Do not use MyChart messaging for urgent concerns.    DIET:  We do recommend a small meal at first, but then you may proceed to your regular diet.  Drink plenty of fluids but you should avoid alcoholic beverages for 24 hours.  ACTIVITY:  You should plan to take it easy  for the rest of today and you should NOT DRIVE or use heavy machinery until tomorrow (because of the sedation medicines used during the test).    FOLLOW UP: Our staff will call the number listed on your records 48-72 hours following your procedure to check on you and address any questions or concerns that you may have regarding the information given to you following your procedure. If we do not reach you, we will leave a message.  We will attempt to reach you two times.  During this call, we will ask if you have developed any symptoms of COVID 19. If you develop any symptoms (ie: fever, flu-like symptoms, shortness of breath, cough etc.) before then, please call [phone].  If you test positive for Covid 19 in the 2 weeks post procedure, please call and report this information to us.    If any biopsies were taken you will be contacted by phone or by letter within the next 1-3 weeks.  Please call us at [phone] if you have not heard about the biopsies in 3 weeks.    SIGNATURES/CONFIDENTIALITY: You and/or your care partner have signed paperwork which will be entered into your electronic medical record.  These signatures attest to the fact that that the information above on your After Visit Summary has been reviewed and is understood.  Full responsibility of the confidentiality of this discharge information lies with you and/or your care-partner.  

## 2021-02-01 NOTE — Progress Notes (Signed)
Called to room to assist during endoscopic procedure.  Patient ID and intended procedure confirmed with present staff. Received instructions for my participation in the procedure from the performing physician.  

## 2021-02-01 NOTE — Progress Notes (Signed)
To pacu, VSS. Report to [email or web address] 

## 2021-02-01 NOTE — Op Note (Signed)
[facility] Patient Name: Thomas Mahoney Procedure Date: 02/01/2021 9:53 AM MRN: [id number] Endoscopist: Thomas Mahoney , MD Age: 45 Referring MD:  Date of Birth: 20-Aug-1976 Gender: Male Account #: [id number] Procedure:                Colonoscopy Indications:              Screening for colorectal malignant neoplasm Medicines:                Monitored Anesthesia Care Procedure:                Pre-Anesthesia Assessment:                           - Prior to the procedure, a History and Physical                            was performed, and patient medications and                            allergies were reviewed. The patient's tolerance of                            previous anesthesia was also reviewed. The risks                            and benefits of the procedure and the sedation                            options and risks were discussed with the patient.                            All questions were answered, and informed consent                            was obtained. Prior Anticoagulants: The patient has                            taken no previous anticoagulant or antiplatelet                            agents. ASA Grade Assessment: I - A normal, healthy                            patient. After reviewing the risks and benefits,                            the patient was deemed in satisfactory condition to                            undergo the procedure.                           After obtaining informed consent, the colonoscope  was passed under direct vision. Throughout the                            procedure, the patient's blood pressure, pulse, and                            oxygen saturations were monitored continuously. The                            [id number] #[id number] was introduced through the anus                            and advanced to the the cecum, identified by                            appendiceal orifice and  ileocecal valve. The                            colonoscopy was performed without difficulty. The                            patient tolerated the procedure well. The quality                            of the bowel preparation was good. The ileocecal                            valve, appendiceal orifice, and rectum were                            photographed. Scope In: 10:15:15 AM Scope Out: 10:32:41 AM Scope Withdrawal Time: 0 hours 14 minutes 29 seconds  Total Procedure Duration: 0 hours 17 minutes 26 seconds  Findings:                 The perianal and digital rectal examinations were                            normal.                           A 3 mm polyp was found in the cecum. The polyp was                            sessile. The polyp was removed with a cold snare.                            Resection and retrieval were complete.                           A 4 mm polyp was found in the transverse colon. The                            polyp was sessile. The polyp was removed with a  cold snare. Resection and retrieval were complete.                           A 5 mm polyp was found in the sigmoid colon. The                            polyp was sessile. The polyp was removed with a                            cold snare. Resection and retrieval were complete.                           A 3 mm polyp was found in the rectum. The polyp was                            sessile. The polyp was removed with a cold snare.                            Resection and retrieval were complete.                           Internal hemorrhoids were found during                            retroflexion. The hemorrhoids were small.                           The exam was otherwise without abnormality. Complications:            No immediate complications. Estimated blood loss:                            Minimal. Estimated Blood Loss:     Estimated blood loss was minimal. Impression:                - One 3 mm polyp in the cecum, removed with a cold                            snare. Resected and retrieved.                           - One 4 mm polyp in the transverse colon, removed                            with a cold snare. Resected and retrieved.                           - One 5 mm polyp in the sigmoid colon, removed with                            a cold snare. Resected and retrieved.                           - One  3 mm polyp in the rectum, removed with a cold                            snare. Resected and retrieved.                           - Internal hemorrhoids.                           - The examination was otherwise normal. Recommendation:           - Patient has a contact number available for                            emergencies. The signs and symptoms of potential                            delayed complications were discussed with the                            patient. Return to normal activities tomorrow.                            Written discharge instructions were provided to the                            patient.                           - Resume previous diet.                           - Continue present medications.                           - Await pathology results. Thomas Lipps P. Thomas Charon, MD 02/01/2021 10:36:40 AM This report has been signed electronically.

## 2021-02-03 ENCOUNTER — Telehealth: Payer: Self-pay | Admitting: *Deleted

## 2021-02-03 NOTE — Telephone Encounter (Signed)
  Follow up Call-  Call back number 02/01/2021  Post procedure Call Back phone  # [phone]  Permission to leave phone message Yes     Patient questions:  Do you have a fever, pain , or abdominal swelling? No. Pain Score  0 *  Have you tolerated food without any problems? Yes.    Have you been able to return to your normal activities? Yes.    Do you have any questions about your discharge instructions: Diet   No. Medications  No. Follow up visit  No.  Do you have questions or concerns about your Care? No.  Actions: * If pain score is 4 or above: 1. No action needed, pain <4.Have you developed a fever since your procedure? no  2.   Have you had an respiratory symptoms (SOB or cough) since your procedure? no  3.   Have you tested positive for COVID 19 since your procedure no  4.   Have you had any family members/close contacts diagnosed with the COVID 19 since your procedure?  no   If yes to any of these questions please route to Joylene John, RN and Joella Prince, RN

## 2021-09-28 ENCOUNTER — Ambulatory Visit: Payer: BC Managed Care – PPO | Admitting: Family Medicine

## 2021-09-28 ENCOUNTER — Encounter: Payer: Self-pay | Admitting: Family Medicine

## 2021-09-28 VITALS — BP 118/82 | HR 75 | Temp 98.6°F | Wt 198.0 lb

## 2021-09-28 DIAGNOSIS — E782 Mixed hyperlipidemia: Secondary | ICD-10-CM | POA: Diagnosis not present

## 2021-09-28 DIAGNOSIS — Z125 Encounter for screening for malignant neoplasm of prostate: Secondary | ICD-10-CM | POA: Diagnosis not present

## 2021-09-28 DIAGNOSIS — R635 Abnormal weight gain: Secondary | ICD-10-CM | POA: Diagnosis not present

## 2021-09-28 DIAGNOSIS — Z0000 Encounter for general adult medical examination without abnormal findings: Secondary | ICD-10-CM

## 2021-09-28 DIAGNOSIS — R7303 Prediabetes: Secondary | ICD-10-CM

## 2021-09-28 LAB — HEMOGLOBIN A1C: Hgb A1c MFr Bld: 6.3 % (ref 4.6–6.5)

## 2021-09-28 LAB — LIPID PANEL
Cholesterol: 272 mg/dL — ABNORMAL HIGH (ref 0–200)
HDL: 38.5 mg/dL — ABNORMAL LOW (ref >39.00)
NonHDL: 233.69
Total CHOL/HDL Ratio: 7
Triglycerides: 212 mg/dL — ABNORMAL HIGH (ref 0.0–149.0)
VLDL: 42.4 mg/dL — ABNORMAL HIGH (ref 0.0–40.0)

## 2021-09-28 LAB — CBC WITH DIFFERENTIAL/PLATELET
Basophils Absolute: 0 10*3/uL (ref 0.0–0.1)
Basophils Relative: 0.7 % (ref 0.0–3.0)
Eosinophils Absolute: 0.2 10*3/uL (ref 0.0–0.7)
Eosinophils Relative: 3.3 % (ref 0.0–5.0)
HCT: 46.2 % (ref 39.0–52.0)
Hemoglobin: 15.3 g/dL (ref 13.0–17.0)
Lymphocytes Relative: 29.4 % (ref 12.0–46.0)
Lymphs Abs: 2.1 10*3/uL (ref 0.7–4.0)
MCHC: 33.1 g/dL (ref 30.0–36.0)
MCV: 92.6 fl (ref 78.0–100.0)
Monocytes Absolute: 0.5 10*3/uL (ref 0.1–1.0)
Monocytes Relative: 7.5 % (ref 3.0–12.0)
Neutro Abs: 4.3 10*3/uL (ref 1.4–7.7)
Neutrophils Relative %: 59.1 % (ref 43.0–77.0)
Platelets: 278 10*3/uL (ref 150.0–400.0)
RBC: 4.99 Mil/uL (ref 4.22–5.81)
RDW: 12.4 % (ref 11.5–15.5)
WBC: 7.2 10*3/uL (ref 4.0–10.5)

## 2021-09-28 LAB — BASIC METABOLIC PANEL
BUN: 11 mg/dL (ref 6–23)
CO2: 27 mEq/L (ref 19–32)
Calcium: 9.8 mg/dL (ref 8.4–10.5)
Chloride: 102 mEq/L (ref 96–112)
Creatinine, Ser: 1.28 mg/dL (ref 0.40–1.50)
GFR: 66.91 mL/min (ref >60.00)
Glucose, Bld: 110 mg/dL — ABNORMAL HIGH (ref 70–99)
Potassium: 4.4 mEq/L (ref 3.5–5.1)
Sodium: 138 mEq/L (ref 135–145)

## 2021-09-28 LAB — VITAMIN D 25 HYDROXY (VIT D DEFICIENCY, FRACTURES): VITD: 66.73 ng/mL (ref 30.00–100.00)

## 2021-09-28 LAB — PSA: PSA: 1.25 ng/mL (ref 0.10–4.00)

## 2021-09-28 LAB — T4, FREE: Free T4: 0.73 ng/dL (ref 0.60–1.60)

## 2021-09-28 LAB — TSH: TSH: 0.66 u[IU]/mL (ref 0.35–5.50)

## 2021-09-28 LAB — LDL CHOLESTEROL, DIRECT: Direct LDL: 199 mg/dL

## 2021-09-28 NOTE — Progress Notes (Signed)
Subjective:     Thomas Mahoney is a 46 y.o. male and is here for a comprehensive physical exam. The patient reports possible food poisoning last week.  Endorses nausea and GI upset after eating corn beef.  States his son also has similar symptoms.  Patient typically does not eat beef.  Notes weight gain since stopping MJ use.  Exercising and eating healthy overall.  Inquires about juicing.  Patient states he is in a good place overall endorses improved sleep since starting magnesium supplement and increasing water intake.  Work is going well.   Social History   Socioeconomic History   Marital status: Single    Spouse name: Not on file   Number of children: 3   Years of education: Not on file   Highest education level: Not on file  Occupational History   Not on file  Tobacco Use   Smoking status: Never   Smokeless tobacco: Never  Vaping Use   Vaping Use: Never used  Substance and Sexual Activity   Alcohol use: Never   Drug use: Not Currently   Sexual activity: Not on file  Other Topics Concern   Not on file  Social History Narrative   Not on file   Social Determinants of Health   Financial Resource Strain: Not on file  Food Insecurity: Not on file  Transportation Needs: Not on file  Physical Activity: Not on file  Stress: Not on file  Social Connections: Not on file  Intimate Partner Violence: Not on file   Health Maintenance  Topic Date Due   TETANUS/TDAP  Never done   COVID-19 Vaccine (4 - Booster for Moderna series) 08/12/2020   INFLUENZA VACCINE  Never done   COLONOSCOPY (Pts 45-40yrs Insurance coverage will need to be confirmed)  02/01/2026   Hepatitis C Screening  Completed   HIV Screening  Completed   Pneumococcal Vaccine 78-92 Years old  Aged Out   HPV VACCINES  Aged Out    The following portions of the patient's history were reviewed and updated as appropriate: allergies, current medications, past family history, past medical history, past social  history, past surgical history, and problem list.  Review of Systems Pertinent items noted in HPI and remainder of comprehensive ROS otherwise negative.   Objective:    BP 118/82 (BP Location: Right Arm, Patient Position: Sitting, Cuff Size: Large)    Pulse 75    Temp 98.6 F (37 C) (Oral)    Wt 198 lb (89.8 kg)    SpO2 99%    BMI 26.85 kg/m  General appearance: alert, cooperative, and no distress Head: Normocephalic, without obvious abnormality, atraumatic Eyes: conjunctivae/corneas clear. PERRL, EOM's intact. Fundi benign. Ears: normal TM's and external ear canals both ears Nose: Nares normal. Septum midline. Mucosa normal. No drainage or sinus tenderness. Throat: lips, mucosa, and tongue normal; teeth and gums normal Neck: no adenopathy, no carotid bruit, no JVD, supple, symmetrical, trachea midline, and thyroid not enlarged, symmetric, no tenderness/mass/nodules Lungs: clear to auscultation bilaterally Heart: regular rate and rhythm, S1, S2 normal, no murmur, click, rub or gallop Abdomen: soft, non-tender; bowel sounds normal; no masses,  no organomegaly Extremities: extremities normal, atraumatic, no cyanosis or edema Pulses: 2+ and symmetric Skin: Skin color, texture, turgor normal. No rashes or lesions Lymph nodes: Cervical, supraclavicular, and axillary nodes normal. Neurologic: Alert and oriented X 3, normal strength and tone. Normal symmetric reflexes. Normal coordination and gait    Assessment:    Healthy male exam.  Plan:    Anticipatory guidance given including wearing seatbelts, smoke detectors in the home, increasing physical activity, increasing p.o. intake of water and vegetables. -obtain labs -Colonoscopy up-to-date done 02/01/2021 -Reviewed immunizations.  Patient declines at this time. Neck CPE in 1 year See After Visit Summary for Counseling Recommendations   Mixed hyperlipidemia -Total cholesterol 262, LDL 95, HDL 41 on 01/02/2021 -Continue lifestyle  modifications -For continued elevation discussed starting statin - Plan: Lipid panel  Prediabetes -Hemoglobin A1c 6.0% on 01/02/2021 -Lifestyle modifications - Plan: Hemoglobin A1c  Screening for prostate cancer  - Plan: PSA  Weight gain  -Likely 2/2 smoking cessation -Continue exercising and other lifestyle modifications - Plan: TSH, T4, Free, Vitamin D, 25-hydroxy  F/u prn  Grier Mitts, MD

## 2021-11-12 IMAGING — DX DG CERVICAL SPINE COMPLETE 4+V
6 series · 6 of 6 positions shown · non-contrast
Comparison: None.

CLINICAL DATA: Paresthesia left arm.

EXAM:
CERVICAL SPINE - COMPLETE 4+ VIEW

[cervical spine lat]
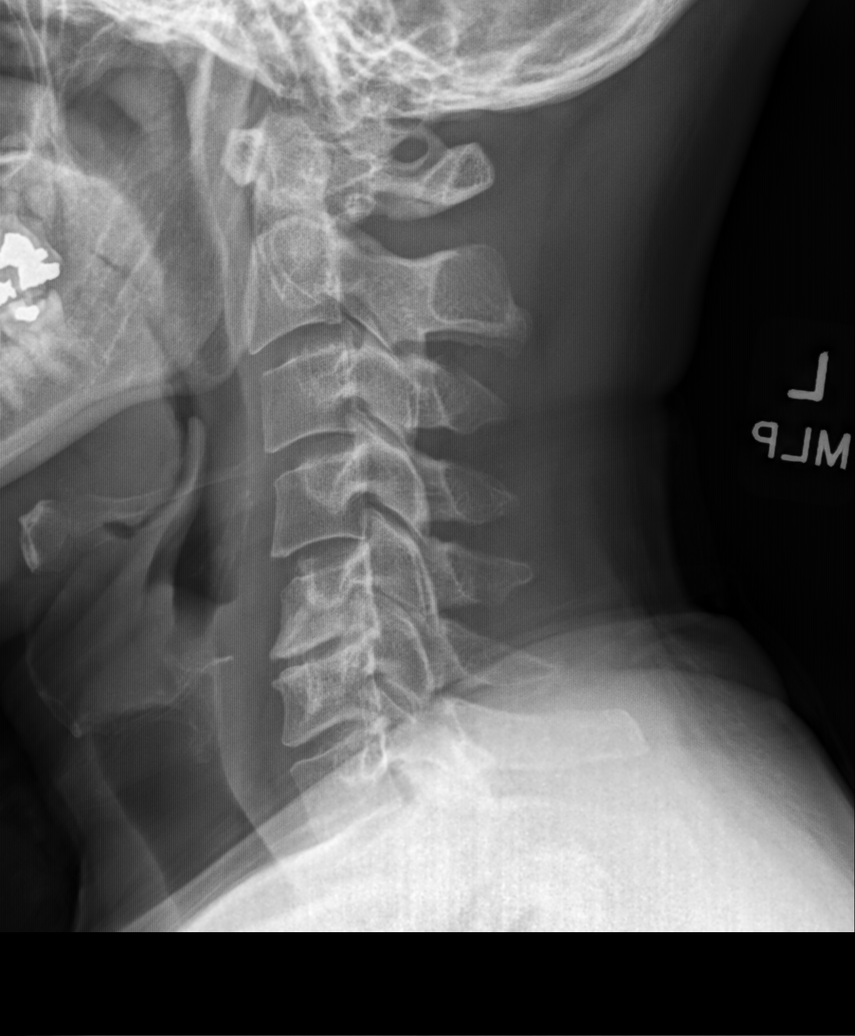

[cervical spine oblique (1 of 2)]
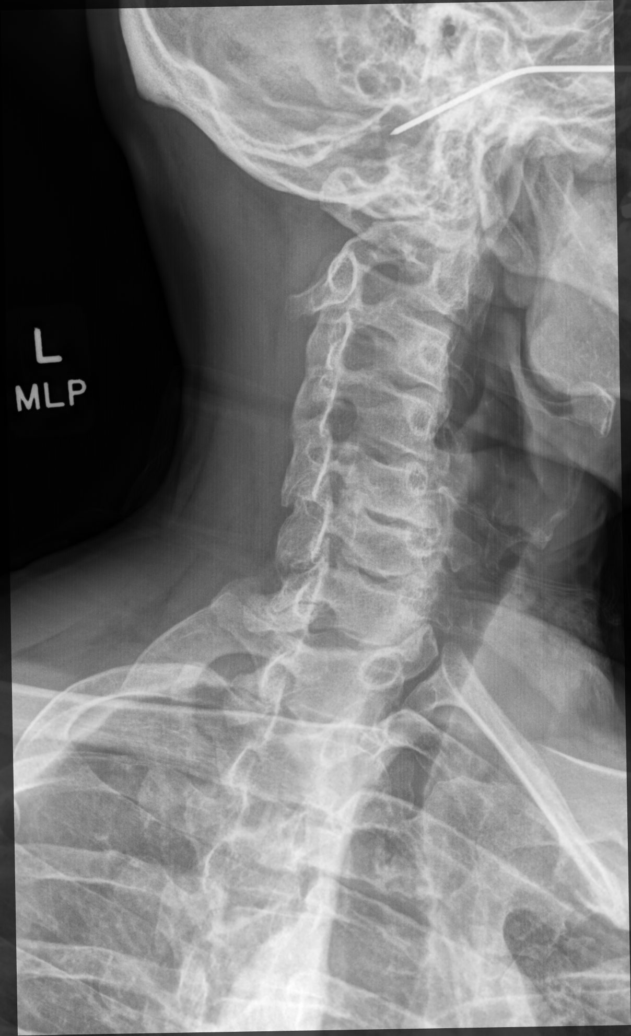

[cervical spine oblique (2 of 2)]
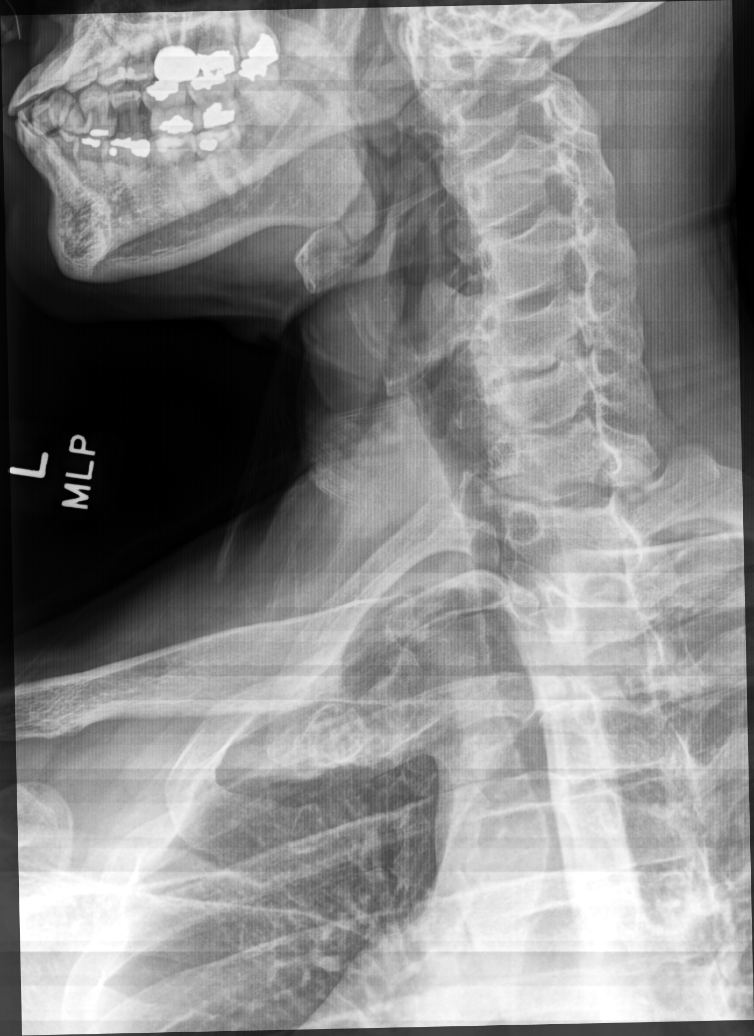

[cervical spine ap]
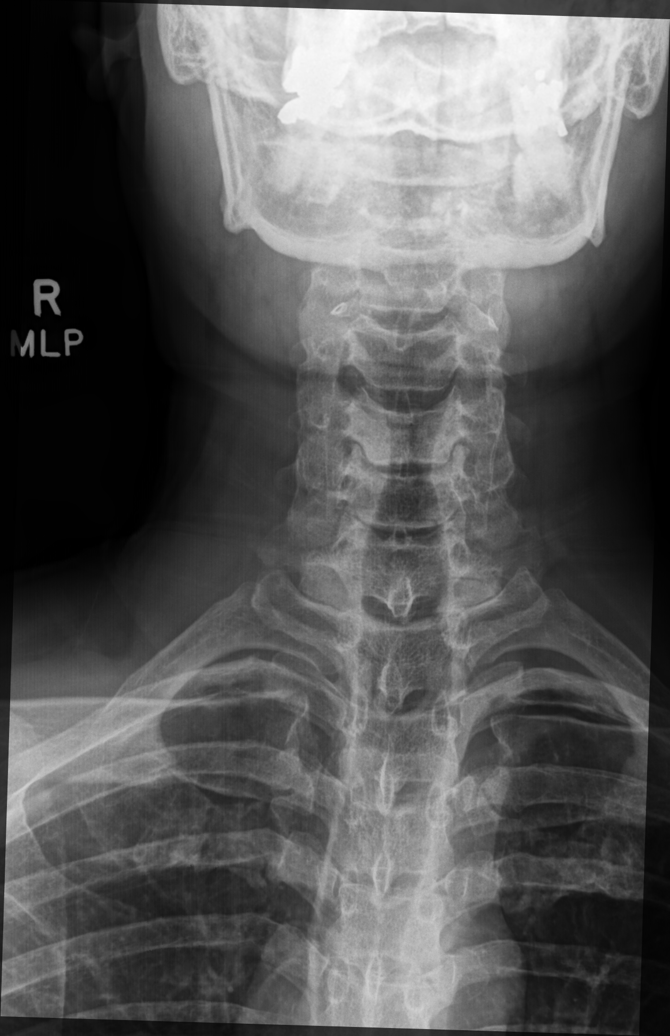

[cervical spine open mouth ap (1 of 2)]
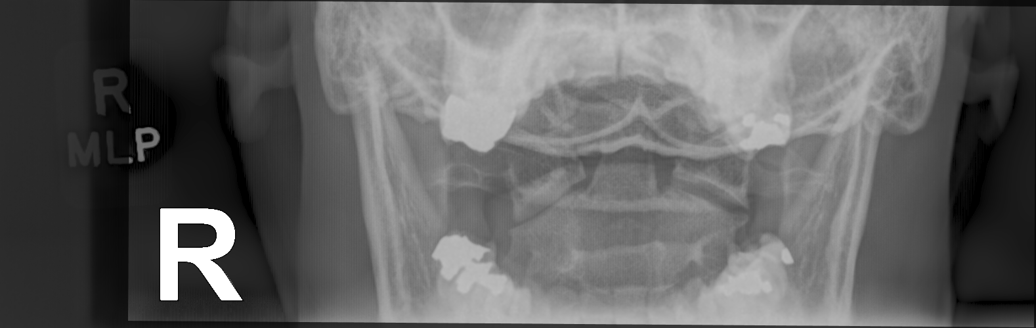

[cervical spine open mouth ap (2 of 2)]
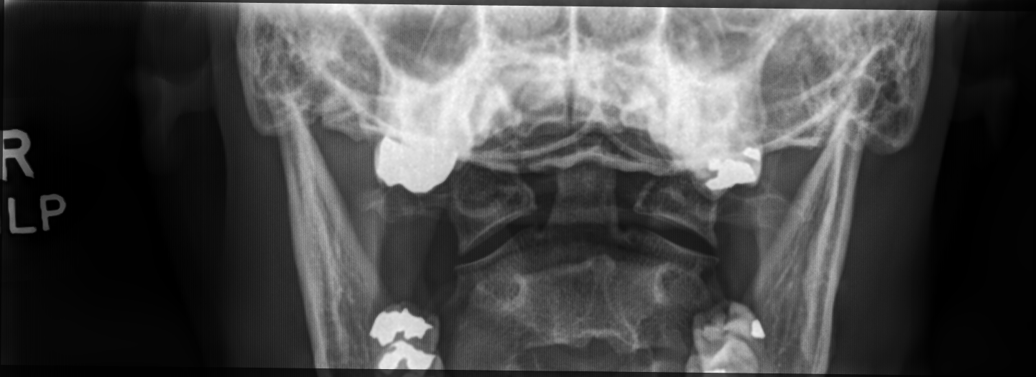

[6 of 6 positions shown; findings below may reference images not displayed]

FINDINGS: Slight retrolisthesis C5-6. Negative for fracture or mass.
Straightening of the cervical lordosis. Prevertebral soft tissues
normal.

Moderate to severe disc degeneration and spurring C5-6 with moderate
to severe foraminal encroachment bilaterally. Moderate foraminal
encroachment bilaterally at C6-7 due to spurring. Remaining foramen
patent.
IMPRESSION: Cervical spondylosis at C5-6 and C6-7 causing foraminal encroachment
bilaterally

## 2022-02-14 ENCOUNTER — Ambulatory Visit: Payer: BC Managed Care – PPO | Admitting: Family Medicine

## 2022-02-14 ENCOUNTER — Encounter: Payer: Self-pay | Admitting: Family Medicine

## 2022-02-14 VITALS — BP 100/70 | HR 67 | Temp 98.8°F | Ht 72.0 in | Wt 186.4 lb

## 2022-02-14 DIAGNOSIS — R5383 Other fatigue: Secondary | ICD-10-CM

## 2022-02-14 DIAGNOSIS — R7303 Prediabetes: Secondary | ICD-10-CM | POA: Diagnosis not present

## 2022-02-14 DIAGNOSIS — G4700 Insomnia, unspecified: Secondary | ICD-10-CM

## 2022-02-14 DIAGNOSIS — E782 Mixed hyperlipidemia: Secondary | ICD-10-CM

## 2022-02-14 LAB — CBC WITH DIFFERENTIAL/PLATELET
Basophils Absolute: 0 10*3/uL (ref 0.0–0.1)
Basophils Relative: 0.3 % (ref 0.0–3.0)
Eosinophils Absolute: 0.1 10*3/uL (ref 0.0–0.7)
Eosinophils Relative: 2 % (ref 0.0–5.0)
HCT: 44.9 % (ref 39.0–52.0)
Hemoglobin: 14.9 g/dL (ref 13.0–17.0)
Lymphocytes Relative: 26.2 % (ref 12.0–46.0)
Lymphs Abs: 1.8 10*3/uL (ref 0.7–4.0)
MCHC: 33.2 g/dL (ref 30.0–36.0)
MCV: 93.4 fl (ref 78.0–100.0)
Monocytes Absolute: 0.5 10*3/uL (ref 0.1–1.0)
Monocytes Relative: 7.3 % (ref 3.0–12.0)
Neutro Abs: 4.3 10*3/uL (ref 1.4–7.7)
Neutrophils Relative %: 64.2 % (ref 43.0–77.0)
Platelets: 219 10*3/uL (ref 150.0–400.0)
RBC: 4.8 Mil/uL (ref 4.22–5.81)
RDW: 12.4 % (ref 11.5–15.5)
WBC: 6.7 10*3/uL (ref 4.0–10.5)

## 2022-02-14 LAB — COMPREHENSIVE METABOLIC PANEL
ALT: 21 U/L (ref 0–53)
AST: 23 U/L (ref 0–37)
Albumin: 4.5 g/dL (ref 3.5–5.2)
Alkaline Phosphatase: 62 U/L (ref 39–117)
BUN: 13 mg/dL (ref 6–23)
CO2: 29 mEq/L (ref 19–32)
Calcium: 10.1 mg/dL (ref 8.4–10.5)
Chloride: 100 mEq/L (ref 96–112)
Creatinine, Ser: 1.23 mg/dL (ref 0.40–1.50)
GFR: 70 mL/min (ref >60.00)
Glucose, Bld: 94 mg/dL (ref 70–99)
Potassium: 4 mEq/L (ref 3.5–5.1)
Sodium: 136 mEq/L (ref 135–145)
Total Bilirubin: 0.5 mg/dL (ref 0.2–1.2)
Total Protein: 8.1 g/dL (ref 6.0–8.3)

## 2022-02-14 LAB — LIPID PANEL
Cholesterol: 242 mg/dL — ABNORMAL HIGH (ref 0–200)
HDL: 49.5 mg/dL (ref >39.00)
LDL Cholesterol: 176 mg/dL — ABNORMAL HIGH (ref 0–99)
NonHDL: 192.93
Total CHOL/HDL Ratio: 5
Triglycerides: 84 mg/dL (ref 0.0–149.0)
VLDL: 16.8 mg/dL (ref 0.0–40.0)

## 2022-02-14 LAB — T4, FREE: Free T4: 0.89 ng/dL (ref 0.60–1.60)

## 2022-02-14 LAB — HEMOGLOBIN A1C: Hgb A1c MFr Bld: 6 % (ref 4.6–6.5)

## 2022-02-14 LAB — VITAMIN D 25 HYDROXY (VIT D DEFICIENCY, FRACTURES): VITD: 74.92 ng/mL (ref 30.00–100.00)

## 2022-02-14 LAB — TSH: TSH: 0.66 u[IU]/mL (ref 0.35–5.50)

## 2022-02-14 LAB — VITAMIN B12: Vitamin B-12: 590 pg/mL (ref 211–911)

## 2022-02-14 NOTE — Progress Notes (Signed)
Subjective:    Patient ID: Thomas Mahoney, male    DOB: 08-20-1976, 46 y.o.   MRN: [id number]  Chief Complaint  Patient presents with   Fatigue    X1 month    HPI Patient was seen today for ongoing concern. Pt endorses intermittent fatigue x 1 month.  May feel tired during the day.  Does not have to take naps.  Does not snore.  Also notes intermittent insomnia.  May go to sleep around 9 pm and wake up at midnight wide awake/unable to fall back asleep.  Pt expresses concerns as he drives frequently for work.  Pt denies increased stress.  Pt cooking at home, has cut down on taking OTC supplements and plant based butter as cholesterol was elevated at last OFV.  Pt notes family h/o thyroid problems (mom and sister).  Patient is fasting this visit.  Past Medical History:  Diagnosis Date   Hyperlipidemia    not on meds at this time-diet controlled    Allergies  Allergen Reactions   Codeine Hives   Lactose Intolerance (Gi)    Family History  Problem Relation Age of Onset   Breast cancer Maternal Grandmother    Colon cancer Maternal Grandfather    Colon polyps Maternal Grandfather    Prostate cancer Maternal Grandfather    Rectal cancer Neg Hx    Stomach cancer Neg Hx    Esophageal cancer Neg Hx     ROS General: Denies fever, chills, night sweats, changes in weight, changes in appetite +fatigue HEENT: Denies headaches, ear pain, changes in vision, rhinorrhea, sore throat CV: Denies CP, palpitations, SOB, orthopnea Pulm: Denies SOB, cough, wheezing GI: Denies abdominal pain, nausea, vomiting, diarrhea, constipation GU: Denies dysuria, hematuria, frequency Msk: Denies muscle cramps, joint pains Neuro: Denies weakness, numbness, tingling Skin: Denies rashes, bruising Psych: Denies depression, anxiety, hallucinations  +insomnia     Objective:    Blood pressure 100/70, pulse 67, temperature 98.8 F (37.1 C), temperature source Oral, height 6' (1.829 m), weight 186 lb 6.4 oz  (84.6 kg), SpO2 99 %.  Gen. Pleasant, well-nourished, in no distress, normal affect   HEENT: [place]/AT, face symmetric, conjunctiva clear, no scleral icterus, PERRLA, EOMI, nares patent without drainage Neck: No JVD, no thyromegaly, no carotid bruits Lungs: no accessory muscle use, CTAB, no wheezes or rales Cardiovascular: RRR, no m/r/g, no peripheral edema Musculoskeletal: No deformities, no cyanosis or clubbing, normal tone Neuro:  A&Ox3, CN II-XII intact, normal gait Skin:  Warm, no lesions/ rash   Wt Readings from Last 3 Encounters:  02/14/22 186 lb 6.4 oz (84.6 kg)  09/28/21 198 lb (89.8 kg)  02/01/21 187 lb (84.8 kg)    Lab Results  Component Value Date   WBC 7.2 09/28/2021   HGB 15.3 09/28/2021   HCT 46.2 09/28/2021   PLT 278.0 09/28/2021   GLUCOSE 110 (H) 09/28/2021   CHOL 272 (H) 09/28/2021   TRIG 212.0 (H) 09/28/2021   HDL 38.50 (L) 09/28/2021   LDLDIRECT 199.0 09/28/2021   LDLCALC 195 (H) 01/02/2021   ALT 16 01/02/2021   AST 18 01/02/2021   NA 138 09/28/2021   K 4.4 09/28/2021   CL 102 09/28/2021   CREATININE 1.28 09/28/2021   BUN 11 09/28/2021   CO2 27 09/28/2021   TSH 0.66 09/28/2021   PSA 1.25 09/28/2021   HGBA1C 6.3 09/28/2021    Assessment/Plan:  Fatigue, unspecified type  -New problem -discussed possible causes of symptoms including thyroid dysfunction as TSH normal on  09/28/21, but but trending down over the last 1-2 yrs.  Pt also has a fam h/o thyroid dysfunction.  Pt encouraged to ask family members about their specific thyroid dx. -also consider electrolyte dysfunction, vitamin deficiency, seasonal allergies, OSA, low testosterone. -Continue lifestyle modifications including diet and exercise. - Plan: CBC with Differential/Platelet, TSH, T4, Free, Hemoglobin A1c, CMP, Vitamin D, 25-hydroxy, Vitamin B12,  Iron, TIBC and Ferritin Panel  Prediabetes  -hgb A1C 6.3% on 09/28/21 -Continue lifestyle modifications - Plan: Hemoglobin A1c  Mixed  hyperlipidemia  -total cholesterol 272, hld 38.5, LDL 195, and triglycerides 212 on 09/28/21 -Continue lifestyle modifications - Plan: Lipid panel  Insomnia, unspecified type  -sleep hygiene reviewed - Plan: TSH, T4, Free  F/u prn in 1-2 months  Grier Mitts, MD

## 2022-02-15 LAB — IRON,TIBC AND FERRITIN PANEL
%SAT: 26 % (calc) (ref 20–48)
Ferritin: 94 ng/mL (ref 38–380)
Iron: 93 ug/dL (ref 50–180)
TIBC: 352 mcg/dL (calc) (ref 250–425)

## 2022-02-27 DIAGNOSIS — F4323 Adjustment disorder with mixed anxiety and depressed mood: Secondary | ICD-10-CM | POA: Diagnosis not present

## 2022-03-08 DIAGNOSIS — F4312 Post-traumatic stress disorder, chronic: Secondary | ICD-10-CM | POA: Diagnosis not present

## 2022-03-15 DIAGNOSIS — F4312 Post-traumatic stress disorder, chronic: Secondary | ICD-10-CM | POA: Diagnosis not present

## 2022-04-27 ENCOUNTER — Ambulatory Visit (INDEPENDENT_AMBULATORY_CARE_PROVIDER_SITE_OTHER): Payer: Self-pay | Admitting: Family Medicine

## 2022-04-27 ENCOUNTER — Encounter: Payer: Self-pay | Admitting: Family Medicine

## 2022-04-27 VITALS — BP 120/78 | HR 84 | Temp 98.9°F | Wt 190.0 lb

## 2022-04-27 DIAGNOSIS — E782 Mixed hyperlipidemia: Secondary | ICD-10-CM

## 2022-04-27 DIAGNOSIS — D229 Melanocytic nevi, unspecified: Secondary | ICD-10-CM

## 2022-04-27 NOTE — Progress Notes (Signed)
Subjective:    Patient ID: Thomas Mahoney, male    DOB: 04-11-1976, 46 y.o.   MRN: [id number]  Chief Complaint  Patient presents with   Nevus    On forearm, someone mentioned the shape was not normal.  Recheck the cholesterol.    HPI Patient was seen today for f/u.  Pt has multiple nevi.  Was advised to have one checked on  R forearm by an acquaintance.  Pt states he does not typically pay attention to the moles.  Thinks mole on forearm has been present times a while.  Denies bleeding, irritation, changes in shape or size.  Pt had a mole removed from L thigh which has since returned.  Patient also inquires about what he can do to improve cholesterol.  Reading labels more, cooking at home more, and eating out less.  No longer eating at [place] when traveling.  Exercising regularly at [employer].  Total cholesterol 242, LDL 176 on 02/14/2022.  Past Medical History:  Diagnosis Date   Hyperlipidemia    not on meds at this time-diet controlled    Allergies  Allergen Reactions   Codeine Hives   Lactose Intolerance (Gi)     ROS General: Denies fever, chills, night sweats, changes in weight, changes in appetite HEENT: Denies headaches, ear pain, changes in vision, rhinorrhea, sore throat CV: Denies CP, palpitations, SOB, orthopnea Pulm: Denies SOB, cough, wheezing GI: Denies abdominal pain, nausea, vomiting, diarrhea, constipation GU: Denies dysuria, hematuria, frequency, vaginal discharge Msk: Denies muscle cramps, joint pains Neuro: Denies weakness, numbness, tingling Skin: Denies rashes, bruising + moles Psych: Denies depression, anxiety, hallucinations     Objective:    Blood pressure 120/78, pulse 84, temperature 98.9 F (37.2 C), temperature source Oral, weight 190 lb (86.2 kg), SpO2 98 %.  Gen. Pleasant, well-nourished, in no distress, normal affect   HEENT: /AT, face symmetric, conjunctiva clear, no scleral icterus, PERRLA, EOMI, nares patent without  drainage Lungs: no accessory muscle use Cardiovascular: RRR Neuro:  A&Ox3, CN II-XII intact, normal gait Skin:  Warm, dry, intact.  Right forearm midline with 4 mm flat nevi, color even, irregular shape, and smooth border. no erythema or irritation.  Several circular plaque nevi on right forearm without irritation.  Wt Readings from Last 3 Encounters:  04/27/22 190 lb (86.2 kg)  02/14/22 186 lb 6.4 oz (84.6 kg)  09/28/21 198 lb (89.8 kg)    Lab Results  Component Value Date   WBC 6.7 02/14/2022   HGB 14.9 02/14/2022   HCT 44.9 02/14/2022   PLT 219.0 02/14/2022   GLUCOSE 94 02/14/2022   CHOL 242 (H) 02/14/2022   TRIG 84.0 02/14/2022   HDL 49.50 02/14/2022   LDLDIRECT 199.0 09/28/2021   LDLCALC 176 (H) 02/14/2022   ALT 21 02/14/2022   AST 23 02/14/2022   NA 136 02/14/2022   K 4.0 02/14/2022   CL 100 02/14/2022   CREATININE 1.23 02/14/2022   BUN 13 02/14/2022   CO2 29 02/14/2022   TSH 0.66 02/14/2022   PSA 1.25 09/28/2021   HGBA1C 6.0 02/14/2022    Assessment/Plan:  Multiple nevi -Multiple nevi present.  Nevus on Right forearm with irregular shape, but smooth regular border -Discussed Abcde changes in moles -Offered excision/Derm referral.  Pt declines at this time -Advised to take a picture of mole to better assess any changes -Advised to use sunscreen -Given handout -Continue to monitor  Mixed hyperlipidemia -Total cholesterol 242, HDL 49.5, LDL 176, triglycerides 84 on 02/14/2022 -Discussed  additional lifestyle modifications -Given handout -Recheck cholesterol at next OFV  F/u prn  Grier Mitts, MD

## 2022-06-08 ENCOUNTER — Ambulatory Visit (INDEPENDENT_AMBULATORY_CARE_PROVIDER_SITE_OTHER): Payer: BC Managed Care – PPO | Admitting: Family Medicine

## 2022-06-08 VITALS — BP 118/86 | HR 82 | Temp 99.1°F | Wt 196.4 lb

## 2022-06-08 DIAGNOSIS — D229 Melanocytic nevi, unspecified: Secondary | ICD-10-CM

## 2022-06-08 NOTE — Progress Notes (Signed)
Subjective:    Patient ID: Thomas Mahoney, male    DOB: Jan 17, 1976, 46 y.o.   MRN: [id number]  Chief Complaint  Patient presents with   Follow-up    Mole on forearm seems to be growing.    HPI Patient was seen today for follow-up.  Patient states mole on right forearm seems to be increasing in size.  Patient interested in removal.  Area is nonpruritic, without drainage or erythema.  Past Medical History:  Diagnosis Date   Hyperlipidemia    not on meds at this time-diet controlled    Allergies  Allergen Reactions   Codeine Hives   Lactose Intolerance (Gi)     ROS General: Denies fever, chills, night sweats, changes in weight, changes in appetite HEENT: Denies headaches, ear pain, changes in vision, rhinorrhea, sore throat CV: Denies CP, palpitations, SOB, orthopnea Pulm: Denies SOB, cough, wheezing GI: Denies abdominal pain, nausea, vomiting, diarrhea, constipation GU: Denies dysuria, hematuria, frequency Msk: Denies muscle cramps, joint pains Neuro: Denies weakness, numbness, tingling Skin: Denies rashes, bruising+ mole on right forearm Psych: Denies depression, anxiety, hallucinations     Objective:    Blood pressure 118/86, pulse 82, temperature 99.1 F (37.3 C), temperature source Oral, weight 196 lb 6.4 oz (89.1 kg), SpO2 99 %.  Gen. Pleasant, well-nourished, in no distress, normal affect   HEENT: [place]/AT, face symmetric, conjunctiva clear, no scleral icterus, PERRLA, EOMI, nares patent without drainage Lungs: no accessory muscle use Cardiovascular: RRR, no peripheral edema Neuro:  A&Ox3, CN II-XII intact, normal gait Skin:  Warm, dry, intact.  Right anterior forearm with 4 mm flat nevus irregular shape, even color, no erythema or irritation.   Wt Readings from Last 3 Encounters:  06/08/22 196 lb 6.4 oz (89.1 kg)  04/27/22 190 lb (86.2 kg)  02/14/22 186 lb 6.4 oz (84.6 kg)    Lab Results  Component Value Date   WBC 6.7 02/14/2022   HGB 14.9 02/14/2022    HCT 44.9 02/14/2022   PLT 219.0 02/14/2022   GLUCOSE 94 02/14/2022   CHOL 242 (H) 02/14/2022   TRIG 84.0 02/14/2022   HDL 49.50 02/14/2022   LDLDIRECT 199.0 09/28/2021   LDLCALC 176 (H) 02/14/2022   ALT 21 02/14/2022   AST 23 02/14/2022   NA 136 02/14/2022   K 4.0 02/14/2022   CL 100 02/14/2022   CREATININE 1.23 02/14/2022   BUN 13 02/14/2022   CO2 29 02/14/2022   TSH 0.66 02/14/2022   PSA 1.25 09/28/2021   HGBA1C 6.0 02/14/2022    Assessment/Plan:  Nevus  -Given patient concern for lesion increasing in size will place referral to dermatology for removal - Plan: Ambulatory referral to Dermatology  F/u as needed  Grier Mitts, MD

## 2022-06-21 ENCOUNTER — Encounter: Payer: Self-pay | Admitting: Family Medicine

## 2022-06-29 ENCOUNTER — Encounter: Payer: Self-pay | Admitting: Family Medicine

## 2022-08-31 ENCOUNTER — Ambulatory Visit: Payer: BC Managed Care – PPO | Admitting: Family Medicine

## 2022-08-31 VITALS — BP 112/80 | HR 72 | Temp 98.5°F | Wt 191.2 lb

## 2022-08-31 DIAGNOSIS — R052 Subacute cough: Secondary | ICD-10-CM

## 2022-08-31 DIAGNOSIS — R0982 Postnasal drip: Secondary | ICD-10-CM | POA: Diagnosis not present

## 2022-08-31 MED ORDER — FLUTICASONE PROPIONATE 50 MCG/ACT NA SUSP: 1.0000 | Freq: Every day | NASAL | 2 refills | Status: AC

## 2022-08-31 NOTE — Progress Notes (Signed)
Subjective:    Patient ID: Thomas Mahoney, male    DOB: 1976/03/05, 46 y.o.   MRN: [id number]  Chief Complaint  Patient presents with   Cough    Pt reports he has a lingering cough and congestion from when he had covid about a month ago. Clear phlegms. Taking mucinex.denied sob or chest pressure or new sx.     HPI Patient was seen today for ongoing concern.  Patient endorses having COVID 19 on 07/22/2022.  Symptoms included cold/sinus symptoms with cough and congestion.  Patient endorses increased clear drainage with productive cough since resolution of other symptoms.  Denies fever, chills, sore throat, facial pain/pressure, ear pain/pressure, SOB.  Tried Mucinex for symptoms.  Past Medical History:  Diagnosis Date   Hyperlipidemia    not on meds at this time-diet controlled    Allergies  Allergen Reactions   Codeine Hives   Lactose Intolerance (Gi)     ROS General: Denies fever, chills, night sweats, changes in weight, changes in appetite HEENT: Denies headaches, ear pain, changes in vision, rhinorrhea, sore throat + congestion  CV: Denies CP, palpitations, SOB, orthopnea Pulm: Denies SOB, wheezing  + cough with clear phlegm GI: Denies abdominal pain, nausea, vomiting, diarrhea, constipation GU: Denies dysuria, hematuria, frequency, vaginal discharge Msk: Denies muscle cramps, joint pains Neuro: Denies weakness, numbness, tingling Skin: Denies rashes, bruising Psych: Denies depression, anxiety, hallucinations      Objective:    Blood pressure 112/80, pulse 72, temperature 98.5 F (36.9 C), temperature source Oral, weight 191 lb 3.2 oz (86.7 kg), SpO2 97 %.  Gen. Pleasant, well-nourished, in no distress, normal affect   HEENT: [place]/AT, face symmetric, conjunctiva clear, no scleral icterus, PERRLA, EOMI, nares patent without drainage, pharynx with clear postnasal drainage, no erythema or exudate.  TMs normal bilaterally Lungs: Intermittent cough, no accessory muscle use,  CTAB, no wheezes or rales Cardiovascular: RRR, no m/r/g, no peripheral edema Neuro:  A&Ox3, CN II-XII intact, normal gait Skin:  Warm, no lesions/ rash   Wt Readings from Last 3 Encounters:  08/31/22 191 lb 3.2 oz (86.7 kg)  06/08/22 196 lb 6.4 oz (89.1 kg)  04/27/22 190 lb (86.2 kg)    Lab Results  Component Value Date   WBC 6.7 02/14/2022   HGB 14.9 02/14/2022   HCT 44.9 02/14/2022   PLT 219.0 02/14/2022   GLUCOSE 94 02/14/2022   CHOL 242 (H) 02/14/2022   TRIG 84.0 02/14/2022   HDL 49.50 02/14/2022   LDLDIRECT 199.0 09/28/2021   LDLCALC 176 (H) 02/14/2022   ALT 21 02/14/2022   AST 23 02/14/2022   NA 136 02/14/2022   K 4.0 02/14/2022   CL 100 02/14/2022   CREATININE 1.23 02/14/2022   BUN 13 02/14/2022   CO2 29 02/14/2022   TSH 0.66 02/14/2022   PSA 1.25 09/28/2021   HGBA1C 6.0 02/14/2022    Assessment/Plan:  Post-nasal drainage - Plan: fluticasone (FLONASE) 50 MCG/ACT nasal spray  Subacute cough  Advised postviral cough can linger for 4-6 weeks.  Postnasal drainage likely increasing cough.  OTC antihistamines, Flonase nasal spray or saline nasal rinse and other supportive care.  For continued or worsening symptoms obtain CXR.  F/u as needed in the next 1-2 weeks  Grier Mitts, MD

## 2022-09-25 ENCOUNTER — Encounter: Payer: Self-pay | Admitting: Family Medicine

## 2022-12-21 ENCOUNTER — Ambulatory Visit (INDEPENDENT_AMBULATORY_CARE_PROVIDER_SITE_OTHER): Payer: BLUE CROSS/BLUE SHIELD | Admitting: Family Medicine

## 2022-12-21 ENCOUNTER — Encounter: Payer: Self-pay | Admitting: Family Medicine

## 2022-12-21 VITALS — BP 100/80 | HR 67 | Temp 98.7°F | Ht 71.0 in | Wt 194.0 lb

## 2022-12-21 DIAGNOSIS — Z23 Encounter for immunization: Secondary | ICD-10-CM | POA: Diagnosis not present

## 2022-12-21 DIAGNOSIS — Z0001 Encounter for general adult medical examination with abnormal findings: Secondary | ICD-10-CM | POA: Diagnosis not present

## 2022-12-21 DIAGNOSIS — Z0000 Encounter for general adult medical examination without abnormal findings: Secondary | ICD-10-CM

## 2022-12-21 DIAGNOSIS — R7303 Prediabetes: Secondary | ICD-10-CM | POA: Diagnosis not present

## 2022-12-21 DIAGNOSIS — R442 Other hallucinations: Secondary | ICD-10-CM

## 2022-12-21 DIAGNOSIS — Z113 Encounter for screening for infections with a predominantly sexual mode of transmission: Secondary | ICD-10-CM

## 2022-12-21 DIAGNOSIS — Z125 Encounter for screening for malignant neoplasm of prostate: Secondary | ICD-10-CM | POA: Diagnosis not present

## 2022-12-21 DIAGNOSIS — E782 Mixed hyperlipidemia: Secondary | ICD-10-CM | POA: Diagnosis not present

## 2022-12-21 LAB — CBC WITH DIFFERENTIAL/PLATELET
Basophils Absolute: 0 10*3/uL (ref 0.0–0.1)
Basophils Relative: 0.4 % (ref 0.0–3.0)
Eosinophils Absolute: 0.2 10*3/uL (ref 0.0–0.7)
Eosinophils Relative: 2.9 % (ref 0.0–5.0)
HCT: 46.1 % (ref 39.0–52.0)
Hemoglobin: 15.5 g/dL (ref 13.0–17.0)
Lymphocytes Relative: 28.8 % (ref 12.0–46.0)
Lymphs Abs: 1.9 10*3/uL (ref 0.7–4.0)
MCHC: 33.7 g/dL (ref 30.0–36.0)
MCV: 93.4 fl (ref 78.0–100.0)
Monocytes Absolute: 0.6 10*3/uL (ref 0.1–1.0)
Monocytes Relative: 8.3 % (ref 3.0–12.0)
Neutro Abs: 4 10*3/uL (ref 1.4–7.7)
Neutrophils Relative %: 59.6 % (ref 43.0–77.0)
Platelets: 239 10*3/uL (ref 150.0–400.0)
RBC: 4.93 Mil/uL (ref 4.22–5.81)
RDW: 12.3 % (ref 11.5–15.5)
WBC: 6.7 10*3/uL (ref 4.0–10.5)

## 2022-12-21 LAB — COMPREHENSIVE METABOLIC PANEL
ALT: 19 U/L (ref 0–53)
AST: 19 U/L (ref 0–37)
Albumin: 4.5 g/dL (ref 3.5–5.2)
Alkaline Phosphatase: 78 U/L (ref 39–117)
BUN: 8 mg/dL (ref 6–23)
CO2: 27 mEq/L (ref 19–32)
Calcium: 9.7 mg/dL (ref 8.4–10.5)
Chloride: 103 mEq/L (ref 96–112)
Creatinine, Ser: 1.19 mg/dL (ref 0.40–1.50)
GFR: 72.4 mL/min (ref >60.00)
Glucose, Bld: 91 mg/dL (ref 70–99)
Potassium: 4.2 mEq/L (ref 3.5–5.1)
Sodium: 138 mEq/L (ref 135–145)
Total Bilirubin: 0.4 mg/dL (ref 0.2–1.2)
Total Protein: 7.7 g/dL (ref 6.0–8.3)

## 2022-12-21 LAB — T4, FREE: Free T4: 0.79 ng/dL (ref 0.60–1.60)

## 2022-12-21 LAB — PSA: PSA: 1.68 ng/mL (ref 0.10–4.00)

## 2022-12-21 LAB — HEMOGLOBIN A1C: Hgb A1c MFr Bld: 6.2 % (ref 4.6–6.5)

## 2022-12-21 LAB — TSH: TSH: 0.59 u[IU]/mL (ref 0.35–5.50)

## 2022-12-21 LAB — LIPID PANEL
Cholesterol: 254 mg/dL — ABNORMAL HIGH (ref 0–200)
HDL: 42.9 mg/dL (ref >39.00)
LDL Cholesterol: 185 mg/dL — ABNORMAL HIGH (ref 0–99)
NonHDL: 211.3
Total CHOL/HDL Ratio: 6
Triglycerides: 133 mg/dL (ref 0.0–149.0)
VLDL: 26.6 mg/dL (ref 0.0–40.0)

## 2022-12-21 NOTE — Progress Notes (Signed)
Established Patient Office Visit   Subjective  Patient ID: Thomas Mahoney, male    DOB: 09/17/1975  Age: 47 y.o. MRN: [id number]  Chief Complaint  Patient presents with   Annual Exam    Pt is a 47 yo male with pmh sig for seasonal allergies, pre-DM, HLD presents for CPE.  Patient doing well overall.  Went on a cruise since last visit and did a hemoglobin A1c 6 stand-up comedy routine.  Endorses smelling cigarettes  in various settings, though he does not smoke and no one around him is smoking. Noticed the smell in his home and in a hotel. Started in December and has continued off and on since.  Wears glasses. Patient denies other symptoms such as sinus pressure/pain, rhinorrhea, nasal congestion, ear pressure/pain, headaches, changes in vision, weakness.    Patient Active Problem List   Diagnosis Date Noted   Paresthesia 07/07/2020   Mixed hyperlipidemia 01/01/2020   Prediabetes 01/01/2020   Past Surgical History:  Procedure Laterality Date   BREAST BIOPSY Right    2004-due to cyst which was benign per patient   [place]OSCOPY  2007   [place]normal per [place]   VASECTOMY     Social History   Tobacco Use   Smoking status: Never   Smokeless tobacco: Never  Vaping Use   Vaping Use: Never used  Substance Use Topics   Alcohol use: Never   Drug use: Not Currently   Family History  Problem Relation Age of Onset   Breast cancer Maternal Grandmother    Colon cancer Maternal Grandfather    Colon polyps Maternal Grandfather    Prostate cancer Maternal Grandfather    Rectal cancer Neg Hx    Stomach cancer Neg Hx    Esophageal cancer Neg Hx    Allergies  Allergen Reactions   Codeine Hives   Lactose Intolerance (Gi)       ROS Negative unless stated above    Objective:     BP 100/80   Pulse 67   Temp 98.7 F (37.1 C) (Oral)   Ht 5\' 11"  (1.803 m)   Wt 194 lb (88 kg)   SpO2 97%   BMI 27.06 kg/m  BP Readings from Last 3 Encounters:  12/21/22 100/80  08/31/22  112/80  06/08/22 118/86   Wt Readings from Last 3 Encounters:  12/21/22 194 lb (88 kg)  08/31/22 191 lb 3.2 oz (86.7 kg)  06/08/22 196 lb 6.4 oz (89.1 kg)      Physical Exam Constitutional:      Appearance: Normal appearance.  HENT:     Head: Normocephalic and atraumatic.     Right Ear: Tympanic membrane, ear canal and external ear normal.     Left Ear: Tympanic membrane, ear canal and external ear normal.     Nose: Nose normal.     Mouth/Throat:     Mouth: Mucous membranes are moist.     Pharynx: No oropharyngeal exudate or posterior oropharyngeal erythema.  Eyes:     General: No scleral icterus.    Extraocular Movements: Extraocular movements intact.     Conjunctiva/sclera: Conjunctivae normal.     Pupils: Pupils are equal, round, and reactive to light.  Neck:     Thyroid: No thyromegaly.  Cardiovascular:     Rate and Rhythm: Normal rate and regular rhythm.     Pulses: Normal pulses.     Heart sounds: Normal heart sounds. No murmur heard.    No friction rub.  Pulmonary:     Effort: Pulmonary effort is normal.     Breath sounds: Normal breath sounds. No wheezing, rhonchi or rales.  Abdominal:     General: Bowel sounds are normal.     Palpations: Abdomen is soft.     Tenderness: There is no abdominal tenderness.  Musculoskeletal:        General: No deformity. Normal range of motion.  Lymphadenopathy:     Cervical: No cervical adenopathy.  Skin:    General: Skin is warm and dry.     Findings: No lesion.  Neurological:     General: No focal deficit present.     Mental Status: He is alert and oriented to person, place, and time.  Psychiatric:        Mood and Affect: Mood normal.        Thought Content: Thought content normal.     No results found for any visits on 12/21/22.    Assessment & Plan:  Well adult exam -Anticipatory guidance given including wearing seatbelts, smoke detectors in the home, increasing physical activity, increasing p.o. intake of water  and vegetables. -colonoscopy 02/01/21 -     CBC with Differential/Platelet -     TSH -     T4, free  Screening for prostate cancer -     PSA  Mixed hyperlipidemia -Total cholesterol 242, LDL 176, triglycerides 84, HDL 49.5 on 02/14/2022. -Continue lifestyle modifications -Continue fish oil tablets -     Comprehensive metabolic panel -     CBC with Differential/Platelet -     Lipid panel  Prediabetes -Hemoglobin A1c 6.0% on 02/14/2022 -Lifestyle modifications -     TSH -     T4, free -     Hemoglobin A1c  Routine screening for STI (sexually transmitted infection) -     HIV Antibody (routine testing w rflx) -     RPR -     C. trachomatis/N. gonorrhoeae RNA  Olfactory hallucinations -new problem -smelling cigarette smoke x 4 mos -imaging to r/o mass, chronic sinusitis or other cause. -     MR BRAIN W WO CONTRAST; Future  Need for Tdap vaccination -     Tdap vaccine greater than or equal to 7yo IM    Return if symptoms worsen or fail to improve.   Thomas Saint, MD

## 2022-12-24 LAB — C. TRACHOMATIS/N. GONORRHOEAE RNA
C. trachomatis RNA, TMA: NOT DETECTED
N. gonorrhoeae RNA, TMA: NOT DETECTED

## 2022-12-24 LAB — RPR: RPR Ser Ql: NONREACTIVE

## 2022-12-24 LAB — HIV ANTIBODY (ROUTINE TESTING W REFLEX): HIV 1&2 Ab, 4th Generation: NONREACTIVE

## 2023-01-17 ENCOUNTER — Telehealth: Payer: Self-pay | Admitting: Family Medicine

## 2023-01-17 NOTE — Telephone Encounter (Signed)
Checking on progress of the PA for MRI of the brain. Pt's appointment is scheduled for 01/21/23

## 2023-01-21 ENCOUNTER — Other Ambulatory Visit: Payer: BLUE CROSS/BLUE SHIELD

## 2023-12-23 ENCOUNTER — Encounter: Payer: BC Managed Care – PPO | Admitting: Family Medicine

## 2023-12-23 ENCOUNTER — Ambulatory Visit (INDEPENDENT_AMBULATORY_CARE_PROVIDER_SITE_OTHER): Admitting: Family Medicine

## 2023-12-23 ENCOUNTER — Encounter: Payer: Self-pay | Admitting: Family Medicine

## 2023-12-23 VITALS — BP 126/82 | HR 61 | Temp 97.8°F | Ht 71.65 in | Wt 199.4 lb

## 2023-12-23 DIAGNOSIS — Z113 Encounter for screening for infections with a predominantly sexual mode of transmission: Secondary | ICD-10-CM | POA: Diagnosis not present

## 2023-12-23 DIAGNOSIS — Z0000 Encounter for general adult medical examination without abnormal findings: Secondary | ICD-10-CM

## 2023-12-23 DIAGNOSIS — E782 Mixed hyperlipidemia: Secondary | ICD-10-CM

## 2023-12-23 DIAGNOSIS — Z125 Encounter for screening for malignant neoplasm of prostate: Secondary | ICD-10-CM | POA: Diagnosis not present

## 2023-12-23 NOTE — Progress Notes (Signed)
 Established Patient Office Visit   Subjective  Patient ID: Thomas Mahoney, male    DOB: [birth year]  Age: 48 y.o. MRN: [id number]  Chief Complaint  Patient presents with   Annual Exam    Pt is a 48 yo male seen for CPE.  Pt doing well and without complaint.  Staying busy with work.    Patient Active Problem List   Diagnosis Date Noted   Paresthesia 07/07/2020   Mixed hyperlipidemia 01/01/2020   Prediabetes 01/01/2020   Past Medical History:  Diagnosis Date   Hyperlipidemia    not on meds at this time-diet controlled   Past Surgical History:  Procedure Laterality Date   BREAST BIOPSY Right    2004-due to cyst which was benign per patient   COLONOSCOPY  2007   in [place] -normal per pt   VASECTOMY     Social History   Tobacco Use   Smoking status: Never   Smokeless tobacco: Never  Vaping Use   Vaping status: Never Used  Substance Use Topics   Alcohol use: Never   Drug use: Not Currently   Family History  Problem Relation Age of Onset   Breast cancer Maternal Grandmother    Colon cancer Maternal Grandfather    Colon polyps Maternal Grandfather    Prostate cancer Maternal Grandfather    Rectal cancer Neg Hx    Stomach cancer Neg Hx    Esophageal cancer Neg Hx    Allergies  Allergen Reactions   Codeine Hives   Lactose Intolerance (Gi)       ROS Negative unless stated above    Objective:     BP 126/82 (BP Location: Right Arm, Patient Position: Sitting, Cuff Size: Normal)   Pulse 61   Temp 97.8 F (36.6 C) (Oral)   Ht 5' 11.65" (1.82 m)   Wt 199 lb 6.4 oz (90.4 kg)   SpO2 97%   BMI 27.31 kg/m  BP Readings from Last 3 Encounters:  12/23/23 126/82  12/21/22 100/80  08/31/22 112/80   Wt Readings from Last 3 Encounters:  12/23/23 199 lb 6.4 oz (90.4 kg)  12/21/22 194 lb (88 kg)  08/31/22 191 lb 3.2 oz (86.7 kg)    Physical Exam Constitutional:      Appearance: Normal appearance.  HENT:     Head: Normocephalic and atraumatic.      Right Ear: Tympanic membrane, ear canal and external ear normal.     Left Ear: Tympanic membrane, ear canal and external ear normal.     Nose: Nose normal.     Mouth/Throat:     Mouth: Mucous membranes are moist.     Pharynx: No oropharyngeal exudate or posterior oropharyngeal erythema.  Eyes:     General: No scleral icterus.    Extraocular Movements: Extraocular movements intact.     Conjunctiva/sclera: Conjunctivae normal.     Pupils: Pupils are equal, round, and reactive to light.  Neck:     Thyroid: No thyromegaly.  Cardiovascular:     Rate and Rhythm: Normal rate and regular rhythm.     Pulses: Normal pulses.     Heart sounds: Normal heart sounds. No murmur heard.    No friction rub.  Pulmonary:     Effort: Pulmonary effort is normal.     Breath sounds: Normal breath sounds. No wheezing, rhonchi or rales.  Abdominal:     General: Bowel sounds are normal.     Palpations: Abdomen is soft.     Tenderness:  There is no abdominal tenderness.  Musculoskeletal:        General: No deformity. Normal range of motion.  Lymphadenopathy:     Cervical: No cervical adenopathy.  Skin:    General: Skin is warm and dry.     Findings: No lesion.  Neurological:     General: No focal deficit present.     Mental Status: He is alert and oriented to person, place, and time.  Psychiatric:        Mood and Affect: Mood normal.        Thought Content: Thought content normal.       12/23/2023    5:39 PM 12/21/2022   10:27 AM 08/31/2022   10:37 AM  Depression screen PHQ 2/9  Decreased Interest 0 0 0  Down, Depressed, Hopeless 0 0 0  PHQ - 2 Score 0 0 0  Altered sleeping 2 3 3   Tired, decreased energy 0 0 0  Change in appetite 0 0 0  Feeling bad or failure about yourself  0 0 0  Trouble concentrating 2 3 0  Moving slowly or fidgety/restless 0 0 0  Suicidal thoughts 0 0 0  PHQ-9 Score 4 6 3   Difficult doing work/chores  Not difficult at all Not difficult at all      12/23/2023    5:40 PM  12/21/2022   10:28 AM  GAD 7 : Generalized Anxiety Score  Nervous, Anxious, on Edge 0 0  Control/stop worrying 0 0  Worry too much - different things 0 0  Trouble relaxing 0 2  Restless 2 0  Easily annoyed or irritable 0 1  Afraid - awful might happen 0 0  Total GAD 7 Score 2 3  Anxiety Difficulty Not difficult at all Not difficult at all       No results found for any visits on 12/23/23.    Assessment & Plan:  Well adult exam -     Comprehensive metabolic panel with GFR; Future -     CBC with Differential/Platelet; Future -     Hemoglobin A1c; Future -     TSH; Future -     T4, free; Future -     Lipid panel; Future -     PSA; Future  Screening for prostate cancer -     PSA; Future  Routine screening for STI (sexually transmitted infection) -     RPR; Future -     HIV Antibody (routine testing w rflx); Future -     C. trachomatis/N. gonorrhoeae RNA; Future  Mixed hyperlipidemia -     Lipid panel; Future  Age-appropriate health screenings discussed.  Obtain labs.  Colonoscopy done 02/01/2021.  Repeat colonoscopy due in 5 years, 2027.  Immunizations reviewed.  Tdap up-to-date done 12/21/2022.  Lifestyle modifications encouraged.  May benefit from statin if cholesterol remains elevated at this visit.  Next CPE in 1 year.  Follow-up sooner if needed.  No follow-ups on file.   Deeann Saint, MD

## 2023-12-24 LAB — CBC WITH DIFFERENTIAL/PLATELET
Basophils Absolute: 0.1 10*3/uL (ref 0.0–0.1)
Basophils Relative: 0.9 % (ref 0.0–3.0)
Eosinophils Absolute: 0.2 10*3/uL (ref 0.0–0.7)
Eosinophils Relative: 3 % (ref 0.0–5.0)
HCT: 47.6 % (ref 39.0–52.0)
Hemoglobin: 15.8 g/dL (ref 13.0–17.0)
Lymphocytes Relative: 33.2 % (ref 12.0–46.0)
Lymphs Abs: 2.3 10*3/uL (ref 0.7–4.0)
MCHC: 33.3 g/dL (ref 30.0–36.0)
MCV: 94.5 fl (ref 78.0–100.0)
Monocytes Absolute: 0.6 10*3/uL (ref 0.1–1.0)
Monocytes Relative: 9 % (ref 3.0–12.0)
Neutro Abs: 3.7 10*3/uL (ref 1.4–7.7)
Neutrophils Relative %: 53.9 % (ref 43.0–77.0)
Platelets: 271 10*3/uL (ref 150.0–400.0)
RBC: 5.04 Mil/uL (ref 4.22–5.81)
RDW: 12.7 % (ref 11.5–15.5)
WBC: 6.9 10*3/uL (ref 4.0–10.5)

## 2023-12-24 LAB — LIPID PANEL
Cholesterol: 319 mg/dL — ABNORMAL HIGH (ref 0–200)
HDL: 41.8 mg/dL (ref >39.00)
LDL Cholesterol: 253 mg/dL — ABNORMAL HIGH (ref 0–99)
NonHDL: 277.37
Total CHOL/HDL Ratio: 8
Triglycerides: 123 mg/dL (ref 0.0–149.0)
VLDL: 24.6 mg/dL (ref 0.0–40.0)

## 2023-12-24 LAB — C. TRACHOMATIS/N. GONORRHOEAE RNA
C. trachomatis RNA, TMA: NOT DETECTED
N. gonorrhoeae RNA, TMA: NOT DETECTED

## 2023-12-24 LAB — TSH: TSH: 0.97 u[IU]/mL (ref 0.35–5.50)

## 2023-12-24 LAB — T4, FREE: Free T4: 0.82 ng/dL (ref 0.60–1.60)

## 2023-12-24 LAB — RPR: RPR Ser Ql: NONREACTIVE

## 2023-12-24 LAB — COMPREHENSIVE METABOLIC PANEL WITH GFR
ALT: 26 U/L (ref 0–53)
AST: 28 U/L (ref 0–37)
Albumin: 4.9 g/dL (ref 3.5–5.2)
Alkaline Phosphatase: 77 U/L (ref 39–117)
BUN: 11 mg/dL (ref 6–23)
CO2: 25 meq/L (ref 19–32)
Calcium: 10 mg/dL (ref 8.4–10.5)
Chloride: 100 meq/L (ref 96–112)
Creatinine, Ser: 1.17 mg/dL (ref 0.40–1.50)
GFR: 73.37 mL/min (ref >60.00)
Glucose, Bld: 90 mg/dL (ref 70–99)
Potassium: 4.1 meq/L (ref 3.5–5.1)
Sodium: 138 meq/L (ref 135–145)
Total Bilirubin: 0.5 mg/dL (ref 0.2–1.2)
Total Protein: 8.1 g/dL (ref 6.0–8.3)

## 2023-12-24 LAB — HIV ANTIBODY (ROUTINE TESTING W REFLEX): HIV 1&2 Ab, 4th Generation: NONREACTIVE

## 2023-12-24 LAB — HEMOGLOBIN A1C: Hgb A1c MFr Bld: 6.4 % (ref 4.6–6.5)

## 2023-12-24 LAB — PSA: PSA: 1.62 ng/mL (ref 0.10–4.00)

## 2023-12-26 ENCOUNTER — Encounter: Payer: Self-pay | Admitting: Family Medicine

## 2023-12-27 ENCOUNTER — Ambulatory Visit (INDEPENDENT_AMBULATORY_CARE_PROVIDER_SITE_OTHER): Admitting: Family Medicine

## 2023-12-27 ENCOUNTER — Encounter: Payer: Self-pay | Admitting: Family Medicine

## 2023-12-27 VITALS — BP 120/86 | HR 53 | Temp 98.4°F | Wt 208.2 lb

## 2023-12-27 DIAGNOSIS — E782 Mixed hyperlipidemia: Secondary | ICD-10-CM

## 2023-12-27 DIAGNOSIS — R7303 Prediabetes: Secondary | ICD-10-CM

## 2023-12-27 NOTE — Progress Notes (Signed)
 Established Patient Office Visit   Subjective  Patient ID: Thomas Mahoney, male    DOB: 02/22/1976  Age: 48 y.o. MRN: [id number]  Chief Complaint  Patient presents with   Follow-up    Labs and Medication     Patient is a 48 year old male seen for review of labs.  Total cholesterol elevated at 319 and LDL cholesterol 253 on labs from 12/23/2023.  Patient has concerns regarding statins and possible side effects.  Eating a fairly healthy diet, cooking at home using olive oil or avocado oil.  Does not eat fried foods.  Patient mentions elevated cholesterol and paternal side of family.    Patient Active Problem List   Diagnosis Date Noted   Paresthesia 07/07/2020   Mixed hyperlipidemia 01/01/2020   Prediabetes 01/01/2020   Past Medical History:  Diagnosis Date   Hyperlipidemia    not on meds at this time-diet controlled   Past Surgical History:  Procedure Laterality Date   BREAST BIOPSY Right    2004-due to cyst which was benign per patient   COLONOSCOPY  2007   in [place] -normal per pt   VASECTOMY     Social History   Tobacco Use   Smoking status: Never   Smokeless tobacco: Never  Vaping Use   Vaping status: Never Used  Substance Use Topics   Alcohol use: Never   Drug use: Not Currently   Family History  Problem Relation Age of Onset   Breast cancer Maternal Grandmother    Colon cancer Maternal Grandfather    Colon polyps Maternal Grandfather    Prostate cancer Maternal Grandfather    Rectal cancer Neg Hx    Stomach cancer Neg Hx    Esophageal cancer Neg Hx    Allergies  Allergen Reactions   Codeine Hives   Lactose Intolerance (Gi)       ROS Negative unless stated above    Objective:     BP 120/86 (BP Location: Right Arm, Patient Position: Sitting, Cuff Size: Normal)   Pulse (!) 53   Temp 98.4 F (36.9 C) (Oral)   Wt 208 lb 3.2 oz (94.4 kg)   SpO2 96%   BMI 28.51 kg/m  BP Readings from Last 3 Encounters:  12/27/23 120/86  12/23/23 126/82   12/21/22 100/80   Wt Readings from Last 3 Encounters:  12/27/23 208 lb 3.2 oz (94.4 kg)  12/23/23 199 lb 6.4 oz (90.4 kg)  12/21/22 194 lb (88 kg)      Physical Exam HENT:     Head: Normocephalic and atraumatic.     Mouth/Throat:     Mouth: Mucous membranes are moist.  Cardiovascular:     Rate and Rhythm: Normal rate.  Pulmonary:     Effort: Pulmonary effort is normal.  Skin:    General: Skin is warm and dry.  Neurological:     Mental Status: He is alert and oriented to person, place, and time.     No results found for any visits on 12/27/23.    Assessment & Plan:  Mixed hyperlipidemia -     Lipid panel; Future  Prediabetes  Total cholesterol 319, LDL cholesterol 253, HDL 41.8, triglycerides 123 on 12/23/2023.  Current 10-year risk of CVD 5.98% and lifetime risk 50%.  Discussed r/b/a of various treatment options for HLD.  Patient would like to proceed with continued lifestyle modifications and recheck cholesterol in the next 3-6 months.  Lipid panel ordered for future.  If remains elevated will rereview treatment  options.  Hemoglobin A1c 6.4% on 02/22/2024.  Given handouts.  Return if symptoms worsen or fail to improve, for HLD.   Deeann Saint, MD
